# Patient Record
Sex: Male | Born: 1945
Health system: Southern US, Community
[De-identification: ages and names within clinical notes are randomized; demographics above are authoritative.]

## PROBLEM LIST (undated history)

## (undated) DIAGNOSIS — K429 Umbilical hernia without obstruction or gangrene: Secondary | ICD-10-CM

## (undated) DIAGNOSIS — E119 Type 2 diabetes mellitus without complications: Secondary | ICD-10-CM

## (undated) DIAGNOSIS — F329 Major depressive disorder, single episode, unspecified: Secondary | ICD-10-CM

## (undated) DIAGNOSIS — C61 Malignant neoplasm of prostate: Secondary | ICD-10-CM

## (undated) DIAGNOSIS — I10 Essential (primary) hypertension: Secondary | ICD-10-CM

## (undated) DIAGNOSIS — I8393 Asymptomatic varicose veins of bilateral lower extremities: Secondary | ICD-10-CM

## (undated) DIAGNOSIS — R319 Hematuria, unspecified: Secondary | ICD-10-CM

## (undated) DIAGNOSIS — N529 Male erectile dysfunction, unspecified: Secondary | ICD-10-CM

## (undated) DIAGNOSIS — G4700 Insomnia, unspecified: Secondary | ICD-10-CM

## (undated) DIAGNOSIS — I639 Cerebral infarction, unspecified: Secondary | ICD-10-CM

## (undated) DIAGNOSIS — N1831 Chronic kidney disease, stage 3a: Secondary | ICD-10-CM

## (undated) DIAGNOSIS — Z9109 Other allergy status, other than to drugs and biological substances: Secondary | ICD-10-CM

## (undated) DIAGNOSIS — M545 Low back pain, unspecified: Secondary | ICD-10-CM

## (undated) DIAGNOSIS — N189 Chronic kidney disease, unspecified: Secondary | ICD-10-CM

## (undated) DIAGNOSIS — I82409 Acute embolism and thrombosis of unspecified deep veins of unspecified lower extremity: Secondary | ICD-10-CM

## (undated) DIAGNOSIS — M1990 Unspecified osteoarthritis, unspecified site: Secondary | ICD-10-CM

## (undated) DIAGNOSIS — M5430 Sciatica, unspecified side: Secondary | ICD-10-CM

## (undated) DIAGNOSIS — E782 Mixed hyperlipidemia: Secondary | ICD-10-CM

## (undated) DIAGNOSIS — E11649 Type 2 diabetes mellitus with hypoglycemia without coma: Secondary | ICD-10-CM

## (undated) DIAGNOSIS — K635 Polyp of colon: Secondary | ICD-10-CM

## (undated) DIAGNOSIS — D689 Coagulation defect, unspecified: Secondary | ICD-10-CM

## (undated) DIAGNOSIS — E785 Hyperlipidemia, unspecified: Secondary | ICD-10-CM

## (undated) DIAGNOSIS — J029 Acute pharyngitis, unspecified: Secondary | ICD-10-CM

## (undated) DIAGNOSIS — I679 Cerebrovascular disease, unspecified: Secondary | ICD-10-CM

## (undated) DIAGNOSIS — E1142 Type 2 diabetes mellitus with diabetic polyneuropathy: Secondary | ICD-10-CM

## (undated) DIAGNOSIS — F411 Generalized anxiety disorder: Secondary | ICD-10-CM

## (undated) DIAGNOSIS — E559 Vitamin D deficiency, unspecified: Secondary | ICD-10-CM

## (undated) DIAGNOSIS — G8929 Other chronic pain: Secondary | ICD-10-CM

## (undated) DIAGNOSIS — T753XXA Motion sickness, initial encounter: Secondary | ICD-10-CM

## (undated) HISTORY — PX: PROSTATECTOMY: SHX69

## (undated) HISTORY — DX: Vitamin D deficiency, unspecified: E55.9

## (undated) HISTORY — PX: HERNIA REPAIR: SHX51

## (undated) HISTORY — DX: Mixed hyperlipidemia: E78.2

## (undated) HISTORY — DX: Generalized anxiety disorder: F41.1

## (undated) HISTORY — DX: Polyp of colon: K63.5

## (undated) HISTORY — DX: Male erectile dysfunction, unspecified: N52.9

## (undated) HISTORY — DX: Cerebral infarction, unspecified: I63.9

## (undated) HISTORY — PX: EYE SURGERY: SHX253

## (undated) HISTORY — DX: Acute embolism and thrombosis of unspecified deep veins of unspecified lower extremity: I82.409

## (undated) HISTORY — PX: OTHER SURGICAL HISTORY: SHX169

## (undated) HISTORY — DX: Hematuria, unspecified: R31.9

## (undated) HISTORY — DX: Type 2 diabetes mellitus without complications: E11.9

## (undated) HISTORY — PX: COLONOSCOPY: SHX174

## (undated) HISTORY — DX: Umbilical hernia without obstruction or gangrene: K42.9

## (undated) HISTORY — DX: Chronic kidney disease, stage 3a: N18.31

## (undated) HISTORY — DX: Other chronic pain: G89.29

## (undated) HISTORY — DX: Malignant neoplasm of prostate: C61

## (undated) HISTORY — DX: Insomnia, unspecified: G47.00

## (undated) HISTORY — PX: VARICOSE VEIN SURGERY: SHX832

## (undated) HISTORY — DX: Essential (primary) hypertension: I10

## (undated) HISTORY — DX: Type 2 diabetes mellitus with diabetic polyneuropathy: E11.42

## (undated) HISTORY — DX: Motion sickness, initial encounter: T75.3XXA

## (undated) HISTORY — PX: CHOLECYSTECTOMY: SHX55

## (undated) HISTORY — DX: Type 2 diabetes mellitus with hypoglycemia without coma: E11.649

## (undated) HISTORY — DX: Acute pharyngitis, unspecified: J02.9

## (undated) HISTORY — DX: Asymptomatic varicose veins of bilateral lower extremities: I83.93

## (undated) HISTORY — DX: Low back pain, unspecified: M54.50

## (undated) HISTORY — DX: Sciatica, unspecified side: M54.30

## (undated) HISTORY — DX: Major depressive disorder, single episode, unspecified: F32.9

## (undated) HISTORY — DX: Coagulation defect, unspecified: D68.9

---

## 2003-05-11 ENCOUNTER — Emergency Department (HOSPITAL_COMMUNITY): Admission: EM | Admit: 2003-05-11 | Discharge: 2003-05-12 | Payer: Self-pay | Admitting: Emergency Medicine

## 2007-10-28 ENCOUNTER — Encounter: Admission: RE | Admit: 2007-10-28 | Discharge: 2008-01-26 | Payer: Self-pay | Admitting: Family Medicine

## 2008-10-08 ENCOUNTER — Inpatient Hospital Stay (HOSPITAL_COMMUNITY): Admission: RE | Admit: 2008-10-08 | Discharge: 2008-10-09 | Payer: Self-pay | Admitting: Urology

## 2008-10-08 ENCOUNTER — Encounter (INDEPENDENT_AMBULATORY_CARE_PROVIDER_SITE_OTHER): Payer: Self-pay | Admitting: Urology

## 2009-05-20 ENCOUNTER — Ambulatory Visit (HOSPITAL_COMMUNITY): Admission: RE | Admit: 2009-05-20 | Discharge: 2009-05-21 | Payer: Self-pay | Admitting: General Surgery

## 2009-05-22 DIAGNOSIS — I6381 Other cerebral infarction due to occlusion or stenosis of small artery: Secondary | ICD-10-CM

## 2009-05-22 HISTORY — DX: Other cerebral infarction due to occlusion or stenosis of small artery: I63.81

## 2010-02-05 ENCOUNTER — Inpatient Hospital Stay (HOSPITAL_COMMUNITY): Admission: EM | Admit: 2010-02-05 | Discharge: 2010-02-06 | Payer: Self-pay | Admitting: Emergency Medicine

## 2010-02-08 ENCOUNTER — Encounter: Admission: RE | Admit: 2010-02-08 | Discharge: 2010-02-08 | Payer: Self-pay | Admitting: Internal Medicine

## 2010-08-04 LAB — DIFFERENTIAL
Basophils Absolute: 0 10*3/uL (ref 0.0–0.1)
Basophils Absolute: 0 10*3/uL (ref 0.0–0.1)
Basophils Relative: 0 % (ref 0–1)
Basophils Relative: 0 % (ref 0–1)
Eosinophils Absolute: 0.1 10*3/uL (ref 0.0–0.7)
Eosinophils Absolute: 0.1 10*3/uL (ref 0.0–0.7)
Eosinophils Relative: 1 % (ref 0–5)
Eosinophils Relative: 1 % (ref 0–5)
Lymphocytes Relative: 16 % (ref 12–46)
Lymphocytes Relative: 21 % (ref 12–46)
Lymphs Abs: 1.5 10*3/uL (ref 0.7–4.0)
Lymphs Abs: 1.9 10*3/uL (ref 0.7–4.0)
Monocytes Absolute: 0.5 10*3/uL (ref 0.1–1.0)
Monocytes Absolute: 0.8 10*3/uL (ref 0.1–1.0)
Monocytes Relative: 6 % (ref 3–12)
Monocytes Relative: 6 % (ref 3–12)
Neutro Abs: 5.3 10*3/uL (ref 1.7–7.7)
Neutro Abs: 9.2 10*3/uL — ABNORMAL HIGH (ref 1.7–7.7)
Neutrophils Relative %: 71 % (ref 43–77)
Neutrophils Relative %: 76 % (ref 43–77)

## 2010-08-04 LAB — COMPREHENSIVE METABOLIC PANEL
ALT: 12 U/L (ref 0–53)
ALT: 16 U/L (ref 0–53)
AST: 17 U/L (ref 0–37)
AST: 19 U/L (ref 0–37)
Albumin: 3.8 g/dL (ref 3.5–5.2)
Albumin: 4.7 g/dL (ref 3.5–5.2)
Alkaline Phosphatase: 27 U/L — ABNORMAL LOW (ref 39–117)
Alkaline Phosphatase: 33 U/L — ABNORMAL LOW (ref 39–117)
BUN: 16 mg/dL (ref 6–23)
BUN: 20 mg/dL (ref 6–23)
CO2: 24 mEq/L (ref 19–32)
CO2: 25 mEq/L (ref 19–32)
Calcium: 8.5 mg/dL (ref 8.4–10.5)
Calcium: 8.9 mg/dL (ref 8.4–10.5)
Chloride: 104 mEq/L (ref 96–112)
Chloride: 111 mEq/L (ref 96–112)
Creatinine, Ser: 1.17 mg/dL (ref 0.4–1.5)
Creatinine, Ser: 1.35 mg/dL (ref 0.4–1.5)
GFR calc Af Amer: >60 mL/min (ref >60)
GFR calc Af Amer: >60 mL/min (ref >60)
GFR calc non Af Amer: 54 mL/min — ABNORMAL LOW (ref >60)
GFR calc non Af Amer: >60 mL/min (ref >60)
Glucose, Bld: 106 mg/dL — ABNORMAL HIGH (ref 70–99)
Glucose, Bld: 146 mg/dL — ABNORMAL HIGH (ref 70–99)
Potassium: 3.5 mEq/L (ref 3.5–5.1)
Potassium: 3.9 mEq/L (ref 3.5–5.1)
Sodium: 135 mEq/L (ref 135–145)
Sodium: 140 mEq/L (ref 135–145)
Total Bilirubin: 0.6 mg/dL (ref 0.3–1.2)
Total Bilirubin: 0.8 mg/dL (ref 0.3–1.2)
Total Protein: 6.2 g/dL (ref 6.0–8.3)
Total Protein: 7.6 g/dL (ref 6.0–8.3)

## 2010-08-04 LAB — RAPID URINE DRUG SCREEN, HOSP PERFORMED
Amphetamines: NOT DETECTED
Barbiturates: NOT DETECTED
Benzodiazepines: NOT DETECTED
Cocaine: NOT DETECTED
Opiates: NOT DETECTED
Tetrahydrocannabinol: NOT DETECTED

## 2010-08-04 LAB — CBC
HCT: 42 % (ref 39.0–52.0)
HCT: 44.3 % (ref 39.0–52.0)
Hemoglobin: 14.5 g/dL (ref 13.0–17.0)
Hemoglobin: 15.3 g/dL (ref 13.0–17.0)
MCH: 31.5 pg (ref 26.0–34.0)
MCH: 31.6 pg (ref 26.0–34.0)
MCHC: 34.4 g/dL (ref 30.0–36.0)
MCHC: 34.5 g/dL (ref 30.0–36.0)
MCV: 91.3 fL (ref 78.0–100.0)
MCV: 91.8 fL (ref 78.0–100.0)
Platelets: 290 10*3/uL (ref 150–400)
Platelets: 366 10*3/uL (ref 150–400)
RBC: 4.58 MIL/uL (ref 4.22–5.81)
RBC: 4.85 MIL/uL (ref 4.22–5.81)
RDW: 12.8 % (ref 11.5–15.5)
RDW: 12.8 % (ref 11.5–15.5)
WBC: 12 10*3/uL — ABNORMAL HIGH (ref 4.0–10.5)
WBC: 7.4 10*3/uL (ref 4.0–10.5)

## 2010-08-04 LAB — GLUCOSE, CAPILLARY
Glucose-Capillary: 111 mg/dL — ABNORMAL HIGH (ref 70–99)
Glucose-Capillary: 115 mg/dL — ABNORMAL HIGH (ref 70–99)
Glucose-Capillary: 117 mg/dL — ABNORMAL HIGH (ref 70–99)
Glucose-Capillary: 122 mg/dL — ABNORMAL HIGH (ref 70–99)
Glucose-Capillary: 131 mg/dL — ABNORMAL HIGH (ref 70–99)
Glucose-Capillary: 96 mg/dL (ref 70–99)

## 2010-08-04 LAB — APTT: aPTT: 34 seconds (ref 24–37)

## 2010-08-04 LAB — LIPID PANEL
Cholesterol: 170 mg/dL (ref 0–200)
HDL: 35 mg/dL — ABNORMAL LOW (ref >39)
Total CHOL/HDL Ratio: 4.9 RATIO
VLDL: 15 mg/dL (ref 0–40)

## 2010-08-04 LAB — URINALYSIS, ROUTINE W REFLEX MICROSCOPIC
Bilirubin Urine: NEGATIVE
Glucose, UA: NEGATIVE mg/dL
Hgb urine dipstick: NEGATIVE
Ketones, ur: NEGATIVE mg/dL
Nitrite: NEGATIVE
Protein, ur: NEGATIVE mg/dL
Specific Gravity, Urine: 1.005 (ref 1.005–1.030)
Urobilinogen, UA: 0.2 mg/dL (ref 0.0–1.0)
pH: 6 (ref 5.0–8.0)

## 2010-08-04 LAB — PROTIME-INR
INR: 0.95 (ref 0.00–1.49)
Prothrombin Time: 12.9 seconds (ref 11.6–15.2)

## 2010-08-04 LAB — CARDIAC PANEL(CRET KIN+CKTOT+MB+TROPI)
CK, MB: 1.8 ng/mL (ref 0.3–4.0)
Relative Index: INVALID (ref 0.0–2.5)
Total CK: 82 U/L (ref 7–232)
Troponin I: 0.02 ng/mL (ref 0.00–0.06)

## 2010-08-04 LAB — FREE PSA

## 2010-08-04 LAB — HEMOGLOBIN A1C
Hgb A1c MFr Bld: 5.8 % — ABNORMAL HIGH (ref <5.7)
Mean Plasma Glucose: 120 mg/dL — ABNORMAL HIGH (ref <117)

## 2010-08-04 LAB — PSA: PSA: <0.01 ng/mL — ABNORMAL LOW (ref 0.10–4.00)

## 2010-08-04 LAB — CK TOTAL AND CKMB (NOT AT ARMC)
CK, MB: 2.2 ng/mL (ref 0.3–4.0)
Relative Index: 2 (ref 0.0–2.5)
Total CK: 108 U/L (ref 7–232)

## 2010-08-04 LAB — T4, FREE: Free T4: 1.42 ng/dL (ref 0.80–1.80)

## 2010-08-04 LAB — TROPONIN I: Troponin I: 0.02 ng/mL (ref 0.00–0.06)

## 2010-08-22 LAB — URINALYSIS, ROUTINE W REFLEX MICROSCOPIC
Bilirubin Urine: NEGATIVE
Glucose, UA: NEGATIVE mg/dL
Hgb urine dipstick: NEGATIVE
Nitrite: NEGATIVE
Specific Gravity, Urine: 1.02 (ref 1.005–1.030)
pH: 5.5 (ref 5.0–8.0)

## 2010-08-22 LAB — DIFFERENTIAL
Lymphocytes Relative: 18 % (ref 12–46)
Lymphs Abs: 1.4 10*3/uL (ref 0.7–4.0)
Monocytes Absolute: 0.6 10*3/uL (ref 0.1–1.0)
Monocytes Relative: 8 % (ref 3–12)
Neutro Abs: 5.5 10*3/uL (ref 1.7–7.7)

## 2010-08-22 LAB — COMPREHENSIVE METABOLIC PANEL
Albumin: 4.2 g/dL (ref 3.5–5.2)
BUN: 23 mg/dL (ref 6–23)
Creatinine, Ser: 1.2 mg/dL (ref 0.4–1.5)
GFR calc Af Amer: >60 mL/min (ref >60)
Total Protein: 7 g/dL (ref 6.0–8.3)

## 2010-08-22 LAB — GLUCOSE, CAPILLARY
Glucose-Capillary: 109 mg/dL — ABNORMAL HIGH (ref 70–99)
Glucose-Capillary: 116 mg/dL — ABNORMAL HIGH (ref 70–99)
Glucose-Capillary: 132 mg/dL — ABNORMAL HIGH (ref 70–99)

## 2010-08-22 LAB — CBC
HCT: 44.8 % (ref 39.0–52.0)
MCV: 92.6 fL (ref 78.0–100.0)
Platelets: 292 10*3/uL (ref 150–400)
RDW: 13.1 % (ref 11.5–15.5)

## 2010-08-30 LAB — CBC
HCT: 43.9 % (ref 39.0–52.0)
Platelets: 256 10*3/uL (ref 150–400)
WBC: 7.3 10*3/uL (ref 4.0–10.5)

## 2010-08-30 LAB — HEMOGLOBIN AND HEMATOCRIT, BLOOD
HCT: 39.1 % (ref 39.0–52.0)
Hemoglobin: 13.6 g/dL (ref 13.0–17.0)

## 2010-08-30 LAB — GLUCOSE, CAPILLARY
Glucose-Capillary: 102 mg/dL — ABNORMAL HIGH (ref 70–99)
Glucose-Capillary: 168 mg/dL — ABNORMAL HIGH (ref 70–99)
Glucose-Capillary: 180 mg/dL — ABNORMAL HIGH (ref 70–99)
Glucose-Capillary: 93 mg/dL (ref 70–99)

## 2010-08-30 LAB — BASIC METABOLIC PANEL
BUN: 29 mg/dL — ABNORMAL HIGH (ref 6–23)
Creatinine, Ser: 1.26 mg/dL (ref 0.4–1.5)
GFR calc non Af Amer: 58 mL/min — ABNORMAL LOW (ref >60)
Potassium: 4.2 mEq/L (ref 3.5–5.1)

## 2010-08-30 LAB — TYPE AND SCREEN: ABO/RH(D): A POS

## 2010-09-02 ENCOUNTER — Emergency Department (HOSPITAL_COMMUNITY)
Admission: EM | Admit: 2010-09-02 | Discharge: 2010-09-02 | Disposition: A | Discharge status: Home / Self Care | Payer: 59 | Attending: Emergency Medicine | Admitting: Emergency Medicine

## 2010-09-02 DIAGNOSIS — E119 Type 2 diabetes mellitus without complications: Secondary | ICD-10-CM | POA: Insufficient documentation

## 2010-09-02 DIAGNOSIS — I10 Essential (primary) hypertension: Secondary | ICD-10-CM | POA: Insufficient documentation

## 2010-09-02 DIAGNOSIS — F29 Unspecified psychosis not due to a substance or known physiological condition: Secondary | ICD-10-CM | POA: Insufficient documentation

## 2010-09-02 DIAGNOSIS — Z8673 Personal history of transient ischemic attack (TIA), and cerebral infarction without residual deficits: Secondary | ICD-10-CM | POA: Insufficient documentation

## 2010-09-02 DIAGNOSIS — E785 Hyperlipidemia, unspecified: Secondary | ICD-10-CM | POA: Insufficient documentation

## 2010-09-02 DIAGNOSIS — R51 Headache: Secondary | ICD-10-CM | POA: Insufficient documentation

## 2010-10-04 NOTE — Op Note (Signed)
Richard Torres, Richard Torres NO.:  [id number]   MEDICAL RECORD NO.:  [id number]          PATIENT TYPE:  INP   LOCATION:  0001                         FACILITY:  [facility]   PHYSICIAN:  Heloise Purpura, MD      DATE OF BIRTH:  [birth year]   DATE OF PROCEDURE:  10/08/2008  DATE OF DISCHARGE:                               OPERATIVE REPORT   PREOPERATIVE DIAGNOSIS:  Clinically localized adenocarcinoma of the  prostate (clinical stage T1c NX MX).   POSTOPERATIVE DIAGNOSIS:  Clinically localized adenocarcinoma of the  prostate (clinical stage T1c NX MX).   PROCEDURE:  Robotic assisted laparoscopic radical prostatectomy  (bilateral nerve sparing).   SURGEON:  Dr. Heloise Purpura.   ASSISTANT:  Delia Chimes, nurse practitioner.   ANESTHESIA:  General.   COMPLICATIONS:  None.   ESTIMATED BLOOD LOSS:  100 mL.   INTRAVENOUS FLUIDS:  900 mL of lactated Ringer's.   SPECIMENS:  Prostate and seminal vesicles.   DISPOSITION:  Specimens to pathology.   DRAINS:  1. #20-French Coude catheter.  2. #19 Blake pelvic drain.   INDICATION:  Mr. Godbee is a 65 year old gentleman with clinically  localized adenocarcinoma of the prostate.  After undergoing an extensive  discussion regarding management options for treatment, he elected to  proceed with surgical therapy and the above procedure.  The potential  risks, complications, and alternative options were discussed in detail  and informed consent was obtained.   DESCRIPTION OF PROCEDURE:  The patient was taken to the operating room  and a general anesthetic was administered.  He was given preoperative  antibiotics, placed in the dorsal lithotomy position, and prepped and  draped in the usual sterile fashion.  Next a preoperative time-out was  performed.  The Foley catheter was then inserted into the bladder and a  site was selected just superior to the umbilicus for placement of the  camera port.  This was placed using a standard  open Hasson technique  which allowed entry into the peritoneal cavity under direct vision  without difficulty.  A 12 mm port was then placed and a pneumoperitoneum  was established.  The zero degrees lens was used to inspect the abdomen  and there was no evidence for any intra-abdominal injuries.  There were  noted to be some adhesions between the omentum and the abdominal wall  just to the left of the umbilicus.  The right-sided ports were then  placed with an 8-mm robotic port and 12-mm assistant port placed in the  right lower quadrant of the abdomen.  A 5-mm port was placed between the  camera port and the right robotic port.  Visualizing the abdomen from  the right lateral 12-mm port, the previously mentioned adhesions were  taken down with sharp scissor dissection and the left-sided ports were  then placed with two 8-mm robotic ports placed in the left lower  quadrant.  All ports were placed under direct vision without difficulty.  The surgical cart was then docked.  With the aid of the cautery  scissors, the bladder was reflected posteriorly and the space of Retzius  was entered.  The endopelvic fascia was then identified and incised.  The levator muscle fibers went swept off the apex of the prostate  thereby isolating the dorsal venous complex.  The dorsal vein was then  stapled and divided with a 45-mm flex ETS stapler.  Attention then  turned to the bladder neck which was identified with the aid of Foley  catheter manipulation.  The anterior bladder neck was divided, thereby  exposing the Foley catheter.  The catheter balloon was deflated and the  catheter was brought into the operative field and used to retract the  prostate anteriorly.  The posterior bladder neck was then identified and  appeared unremarkable.  It was divided and dissection proceeded between  the prostate and bladder until the vasa deferentia and seminal vesicles  were identified.  The vasa deferentia were  isolated, divided and lifted  anteriorly.  The seminal vesicles were then dissected down to their tips  with care to control the seminal vesicle arterial blood supply.  The  seminal vesicles were then lifted anteriorly in the space between  Denonvilliers fascia and the anterior rectum was bluntly developed  thereby isolating the vascular pedicles of the prostate.  The lateral  prostatic fascia was then incised sharply allowing the neurovascular  bundles to be released bilaterally.  The neurovascular bundles were then  swept off the prostate and the vascular pedicles of prostate were  divided with Hem-o-lok clips and divided with sharp cold scissor  dissection.  The neurovascular bundles were swept off the apex of the  prostate and urethra.  The urethra was then sharply transected and the  prostate specimen was disarticulated.  The pelvis was then copiously  irrigated and hemostasis was ensured.  Attention then turned to the  urethral anastomosis.  A 2-0 Vicryl slip-knot was placed between  Denonvilliers fascia, the posterior bladder neck, and the posterior  urethra to reapproximate these structures.  A double-armed 3-0 Monocryl  suture was then used perform a 360 degrees running tension-free  anastomosis between the bladder neck and urethra.  A new 20-French Coude  catheter was inserted into the bladder and irrigated.  There were no  blood clots within the bladder and the anastomosis appeared to be  watertight.  A #19 Blake drain was then brought through the left robotic  port and positioned appropriately within the pelvis.  It was secured to  skin with a nylon suture.  The surgical cart was then undocked.  The  right lateral 12-mm port site was closed with a zero Vicryl suture  placed of the aid of the laparoscopic suture passer device.  All  remaining ports were then removed under direct vision.  The prostate  specimen was removed intact within the Endopouch retrieval bag via the   periumbilical port site.  This port site was then closed with a running  zero Vicryl suture.  All port sites were injected with 0.25% Marcaine  and reapproximated the skin level with staples.  Sterile dressings were  applied.  The patient appeared to tolerate procedure well without  complications.  He was able to be extubated and transferred to the  recovery unit in satisfactory condition.      Heloise Purpura, MD  Electronically Signed     LB/MEDQ  D:  10/08/2008  T:  10/08/2008  Job:  [id number]

## 2011-07-21 ENCOUNTER — Encounter: Payer: Self-pay | Admitting: Internal Medicine

## 2011-07-21 ENCOUNTER — Observation Stay (HOSPITAL_COMMUNITY)
Admission: AD | Admit: 2011-07-21 | Discharge: 2011-07-23 | Disposition: A | Discharge status: Home / Self Care | Payer: 59 | Source: Ambulatory Visit | Attending: Internal Medicine | Admitting: Internal Medicine

## 2011-07-21 ENCOUNTER — Encounter (HOSPITAL_COMMUNITY): Payer: Self-pay | Admitting: Nurse Practitioner

## 2011-07-21 DIAGNOSIS — D649 Anemia, unspecified: Secondary | ICD-10-CM

## 2011-07-21 DIAGNOSIS — R519 Headache, unspecified: Secondary | ICD-10-CM

## 2011-07-21 DIAGNOSIS — R748 Abnormal levels of other serum enzymes: Secondary | ICD-10-CM | POA: Insufficient documentation

## 2011-07-21 DIAGNOSIS — R509 Fever, unspecified: Principal | ICD-10-CM | POA: Insufficient documentation

## 2011-07-21 DIAGNOSIS — N289 Disorder of kidney and ureter, unspecified: Secondary | ICD-10-CM | POA: Insufficient documentation

## 2011-07-21 DIAGNOSIS — Z79899 Other long term (current) drug therapy: Secondary | ICD-10-CM | POA: Insufficient documentation

## 2011-07-21 DIAGNOSIS — R161 Splenomegaly, not elsewhere classified: Secondary | ICD-10-CM

## 2011-07-21 DIAGNOSIS — R55 Syncope and collapse: Secondary | ICD-10-CM | POA: Insufficient documentation

## 2011-07-21 DIAGNOSIS — R51 Headache: Secondary | ICD-10-CM | POA: Diagnosis present

## 2011-07-21 DIAGNOSIS — Z7982 Long term (current) use of aspirin: Secondary | ICD-10-CM | POA: Insufficient documentation

## 2011-07-21 HISTORY — DX: Hyperlipidemia, unspecified: E78.5

## 2011-07-21 HISTORY — DX: Other allergy status, other than to drugs and biological substances: Z91.09

## 2011-07-21 HISTORY — DX: Headache, unspecified: R51.9

## 2011-07-21 HISTORY — DX: Type 2 diabetes mellitus without complications: E11.9

## 2011-07-21 HISTORY — DX: Male erectile dysfunction, unspecified: N52.9

## 2011-07-21 HISTORY — DX: Abnormal levels of other serum enzymes: R74.8

## 2011-07-21 HISTORY — DX: Cerebrovascular disease, unspecified: I67.9

## 2011-07-21 HISTORY — DX: Malignant neoplasm of prostate: C61

## 2011-07-21 HISTORY — DX: Unspecified osteoarthritis, unspecified site: M19.90

## 2011-07-21 HISTORY — DX: Essential (primary) hypertension: I10

## 2011-07-21 HISTORY — DX: Anemia, unspecified: D64.9

## 2011-07-21 HISTORY — DX: Splenomegaly, not elsewhere classified: R16.1

## 2011-07-21 HISTORY — DX: Disorder of kidney and ureter, unspecified: N28.9

## 2011-07-21 LAB — BASIC METABOLIC PANEL
CO2: 25 mEq/L (ref 19–32)
Chloride: 99 mEq/L (ref 96–112)
Sodium: 132 mEq/L — ABNORMAL LOW (ref 135–145)

## 2011-07-21 LAB — HEPATIC FUNCTION PANEL
AST: 60 U/L — ABNORMAL HIGH (ref 0–37)
Albumin: 3.3 g/dL — ABNORMAL LOW (ref 3.5–5.2)
Alkaline Phosphatase: 46 U/L (ref 39–117)
Total Bilirubin: 0.4 mg/dL (ref 0.3–1.2)

## 2011-07-21 LAB — CBC
Hemoglobin: 11.8 g/dL — ABNORMAL LOW (ref 13.0–17.0)
MCH: 28.6 pg (ref 26.0–34.0)
RBC: 4.13 MIL/uL — ABNORMAL LOW (ref 4.22–5.81)

## 2011-07-21 LAB — TSH: TSH: 1.064 u[IU]/mL (ref 0.350–4.500)

## 2011-07-21 LAB — URINALYSIS, ROUTINE W REFLEX MICROSCOPIC
Glucose, UA: NEGATIVE mg/dL
Hgb urine dipstick: NEGATIVE
Ketones, ur: NEGATIVE mg/dL
Protein, ur: NEGATIVE mg/dL

## 2011-07-21 LAB — DIFFERENTIAL
Lymphs Abs: 0.7 10*3/uL (ref 0.7–4.0)
Monocytes Relative: 10 % (ref 3–12)
Neutro Abs: 3 10*3/uL (ref 1.7–7.7)
Neutrophils Relative %: 72 % (ref 43–77)

## 2011-07-21 LAB — SEDIMENTATION RATE: Sed Rate: 12 mm/hr (ref 0–16)

## 2011-07-21 LAB — GLUCOSE, CAPILLARY

## 2011-07-21 MED ORDER — ACETAMINOPHEN 325 MG PO TABS
650.0000 mg | ORAL_TABLET | Freq: Four times a day (QID) | ORAL | Status: DC | PRN
  Administered 2011-07-21: 650 mg via ORAL
  Filled 2011-07-21: qty 2

## 2011-07-21 MED ORDER — ZOLPIDEM TARTRATE 10 MG PO TABS
10.0000 mg | ORAL_TABLET | Freq: Every evening | ORAL | Status: DC | PRN
  Filled 2011-07-21: qty 1

## 2011-07-21 MED ORDER — ACETAMINOPHEN 325 MG PO TABS
650.0000 mg | ORAL_TABLET | Freq: Four times a day (QID) | ORAL | Status: DC | PRN
  Administered 2011-07-23: 650 mg via ORAL
  Filled 2011-07-21 (×2): qty 2

## 2011-07-21 MED ORDER — DIPHENHYDRAMINE HCL 25 MG PO CAPS
25.0000 mg | ORAL_CAPSULE | Freq: Every evening | ORAL | Status: DC | PRN
  Filled 2011-07-21: qty 1
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
## 2011-07-21 NOTE — H&P (Signed)
Hospital Admission Note Date: 07/21/2011  Patient name: Richard Torres Medical record number: [id number] Date of birth: Mar 22, 1946 Age: 66 y.o. Gender: male PCP: Pearla Dubonnet M.D.  Attending physician:   Pearla Dubonnet M.D.  Chief Complaint: Headaches fevers night sweats, recurrent syncope, now on antibiotic therapy, with persistent night sweats and fevers. Empirically being treated for presumed diverticulitis  History of Present Illness: Richard Torres is an 66 y.o. male who presents with one week of a peculiar illness as follows with progressive headaches and fever, on antibiotic therapy. His syndrome started one week ago, last Friday, when he felt somewhat full after his supper while in [place], [place]. He also felt quite weak.The next morning he developed a headache and felt bloated but had no diarrhea. He felt weak the entire day and was having chills. The next morning, last Sunday morning, February 25th, he felt extremely weak while walking down some steps and had near syncope. He is a diabetic and thought his sugar might be low and had some orange juice and felt better but was taken to an emergency room in [place] and had a rather complete workup with abdominal CT scan that showed a slightly enlarged spleen at 14-1/2 cm. It revealed a prior cholecystectomy. He also had CT scan of his head that was normal. The next day he felt better but still had a low-grade headache and a Monospot from the emergency room because of a complaint of sore throat as well, was negative. Urinalysis was normal except for a few bacteria and nitrite was negative. LFTs were normal and CK-MB was normal. I cell count was 7700 with 81% segmented neutrophils and 9% lymphocytes. He had a slightly high monocyte count. Chest x-ray revealed no acute process. Later that day on Monday he started to feel better and it was felt in my office that he probably had a viral syndrome. Laboratories in my  office November 25 revealed a glucose of 190, creatinine 1.3,White blood cell count 4600 hemoglobin 13.9 and platelet count 194,000 with 77% neutrophils. It was felt that he probably had a viral syndrome at that time and we elected for observation. On February 27, 2 days later he came in after having a presyncopal episode in while standing in testing his blood sugar in his bedroom. He woke up on the floor and thought he was actually back in his bed. EMS was called and he had normal vital signs and normal EKG and on evaluation in my office he had left lower quadrant tenderness but otherwise a nonfocal exam. It was elected to start him on Cipro and Flagyl to see if he empirically improved. On exam he had a supple neck, no complaint of headache, a clear chest, normal cardiac exam without murmur, and a fairly exquisite pain to palpation in his left lower quarter. Rectal exam revealed a flat prostatic fossa secondary to previous prostatectomy for prostate cancer. No rashes were noted. Over the past 48 hours he has had temperature as high as 103.5 which seemed to start right after starting Cipro and Flagyl and headaches have been progressive. Still no stiff neck. No visual problems. Concern is for a cerebritis or focal area of CNS infection or other occult infection. We'll plan to hold antibiotic therapy for now and further workup in the hospital. Concerned about possible neoplasm as well.  Will obtain infectious disease consultation  Past Medical History:     Type 2 Diabetes Mellitus     Adenomatous colon polyps -  followed by Dr. Carman Ching     Hypertension     Hyperlipidemia     DJD of the right shoulder with degenerative labrum     Metabolic syndrome  Meds: Cipro 500 mg b.i.d. For 2 days Flagyl 500 mg b.i.d. For 2 days Fish oil 2 g daily Vitamin D 2000 units daily Cialis 10 mg p.r.n. Aspirin 160 mg daily Lipofen 150 mg daily Combines XR 5-100 mg daily Metoprolol tartrate 25 mg b.i.d. Micardis  40 mg daily Crestor 20 mg daily Cinnamon 1 g daily  Allergies: No known drug allergies History   Social History  . Marital Status:   Married    Spouse Name:  Corrie Dandy N/A    Number of Children:  3 N/A  . Years of Education:  Law school graduate N/A   Occupational History  . Attorney   Social History Main Topics  . Smoking status: none Not on file  . Smokeless tobacco: none Not on file  . Alcohol Use: minimal Not on file  . Drug Use: none Not on file  . Sexually Active: yes Not on file   Other Topics Concern  . Not on file   Social History Narrative  . No narrative on file   Family history:  colon polyps in his mother  Surgical history: Bilateral inguinal hernia repairs, prostatectomy, cholecystectomy Review of Systems: Significant for progressive headache without meningismus, no visual disturbances, no focal neurologic deficits. Positive for progressive night sweats and intermittent fevers as high as 103.5. Patient feels washed out. Has had some left lower quadrant pain that has improved. No dysuria. No obvious nausea and vomiting. Positive for chills. 2 episodes of syncope, possibly vagal. No rashes. No diarrhea  Physical Exam: Pending on admission to hospital Exam from previous day: General Appearance: Alert, fatigued, appears washed out. Head: Normocephalic, without obvious abnormality, atraumatic Neck: Supple, symmetrical, trachea midline, no adenopathy; thyroid: No enlargement/tenderness/nodules; no carotid bruit or JVD Lungs: Clear to auscultation bilaterally, respirations unlabored Heart: Regular rate and rhythm, S1 and S2 normal, no murmur, rub or gallop Abdomen: Soft, non-tender, bowel sounds active all four quadrants, no masses, no organomegaly Extremities: Extremities normal, atraumatic, no cyanosis or edema Pulses: 2+ and symmetric all extremities Skin: Skin color, texture, turgor normal, no rashes or lesions Neuro: CNII-XII intact. Normal strength, sensation  and reflexes throughout  Lab results:  As per history of present illness.  Imaging results:  noncontrast head CT, abdominal CT scanning, and chest x-ray were all within normal limits on evaluation on February 23 in [place].  Assessment & Plan: Progressive headache and febrile illness - concerned about possible CNS infection. Needs inpatient workup. We'll consult infectious diseases. May simply be a viral syndrome. Also concerned about occult neoplasm Type 2 diabetes mellitus - check hemoglobin A1c and use sliding scale of NovoLog insulin and hold Kombyglyze Hypertension - aware Left lower quadrant pain felt to be possible diverticulitis - consider repeat CT scanning Hyperlipidemia - aware    Katheryn Culliton NEVILL 07/21/2011, 8:30 AM

## 2011-07-22 LAB — LACTATE DEHYDROGENASE: LDH: 367 U/L — ABNORMAL HIGH (ref 94–250)

## 2011-07-22 LAB — SEDIMENTATION RATE: Sed Rate: 15 mm/hr (ref 0–16)

## 2011-07-22 LAB — HIV ANTIBODY (ROUTINE TESTING W REFLEX): HIV: NONREACTIVE

## 2011-07-22 LAB — GLUCOSE, CAPILLARY
Glucose-Capillary: 172 mg/dL — ABNORMAL HIGH (ref 70–99)
Glucose-Capillary: 130 mg/dL — ABNORMAL HIGH (ref 70–99)

## 2011-07-22 MED ORDER — SENNOSIDES-DOCUSATE SODIUM 8.6-50 MG PO TABS
1.0000 | ORAL_TABLET | Freq: Every evening | ORAL | Status: DC | PRN
  Administered 2011-07-22: 1 via ORAL
  Filled 2011-07-22: qty 1

## 2011-07-22 MED ORDER — ACETAMINOPHEN 325 MG PO TABS
650.0000 mg | ORAL_TABLET | Freq: Once | ORAL | Status: AC
  Administered 2011-07-22: 650 mg via ORAL

## 2011-07-22 NOTE — Progress Notes (Signed)
Subjective: Had severe headache last night and took tylenol for this before "my fever could set in" less night sweats last nigtht   Antibiotics:  Anti-infectives    None      Medications: Scheduled Meds:   . acetaminophen  650 mg Oral Once   Continuous Infusions:  PRN Meds:.acetaminophen, diphenhydrAMINE, zolpidem, DISCONTD: acetaminophen   Objective: Weight change:  No intake or output data in the 24 hours ending 07/22/11 1036 Blood pressure 129/80, pulse 67, temperature 98.5 F (36.9 C), temperature source Oral, resp. rate 18, height 6\' 1"  (1.854 m), weight 185 lb (83.915 kg), SpO2 96.00%. Temp:  [97.6 F (36.4 C)-100.1 F (37.8 C)] 98.5 F (36.9 C) (03/02 0542) Pulse Rate:  [64-76] 67  (03/02 0542) Resp:  [18-20] 18  (03/02 0542) BP: (106-129)/(69-80) 129/80 mmHg (03/02 0542) SpO2:  [94 %-99 %] 96 % (03/02 0542) Weight:  [185 lb (83.915 kg)] 185 lb (83.915 kg) (03/01 1223)  Physical Exam: General: Alert and awake, oriented x3, not in any acute distress. HEENT: anicteric sclera, pupils reactive to light and accommodation, EOMI CVS regular rate, normal r,  no murmur rubs or gallops Chest: clear to auscultation bilaterally, no wheezing, rales or rhonchi Abdomen: soft nontender, nondistended, normal bowel sounds, Extremities: no  clubbing or edema noted bilaterally Skin: no rashes Lymph: no new lymphadenopathy Neuro: nonfocal  Lab Results:  Basename 07/21/11 1245  WBC 4.1  HGB 11.8*  HCT 36.9*  PLT 193    BMET  Basename 07/21/11 1245  NA 132*  K 3.8  CL 99  CO2 25  GLUCOSE 140*  BUN 17  CREATININE 1.40*  CALCIUM 9.0    Micro Results: No results found for this or any previous visit (from the past 240 hour(s)).  Studies/Results: No results found.    Assessment/Plan: Richard Torres is a 66 y.o. male with  FUO with one week hx of fevers, drenching night sweats, headaches with workup that has included negative CXR, CT abd pelvis showing  splenomegaly, labs showing transamitiis, negative blood cultures. He faield to respond to cipro/flagyl for empriic rx of diverticuliltis.  FUO:  --I agree with Dr. Orvan Falconer that a viral illness may be the cause here.  --in addition to checking for EBV,  CMV by abs will check CMV PCR blood, an HIV ab, RNA, hepatitis panel --I will order additional screenjing labs in FUO panel with RF< ANA, CPK, LDH, quantiferon gold --I certainly think aseptic meningitis is also a possible again more likely due to a virus such as HSV 2, 1, or enterovirus, less likely due to TB or a dimorphic fungus --if he is still fevering and with significant headache would consider LP tomorrow and could also consider MRI     LOS: 1 day   Acey Lav 07/22/2011, 10:36 AM

## 2011-07-22 NOTE — Progress Notes (Signed)
Subjective: Currently patient is afebrile, there's no cough no dysuria no productive mucus no URI symptoms no abdominal pain. Admission H&P and infectious disease consults have been reviewed.  Objective: Vital signs in last 24 hours: Temp:  [98.1 F (36.7 C)-100.1 F (37.8 C)] 98.5 F (36.9 C) (03/02 0542) Pulse Rate:  [65-76] 67  (03/02 0542) Resp:  [18-20] 18  (03/02 0542) BP: (107-129)/(72-80) 129/80 mmHg (03/02 0542) SpO2:  [94 %-99 %] 96 % (03/02 0542) Weight change:     Intake/Output from previous day:   Intake/Output this shift:    General appearance: alert and cooperative Resp: clear to auscultation bilaterally Cardio: regular rate and rhythm, S1, S2 normal, no murmur, click, rub or gallop Extremities: extremities normal, atraumatic, no cyanosis or edema Neurologic: Grossly normal  Lab Results:  Results for orders placed during the hospital encounter of 07/21/11 (from the past 24 hour(s))  URINALYSIS, ROUTINE W REFLEX MICROSCOPIC     Status: Abnormal   Collection Time   07/21/11  2:58 PM      Component Value Range   Color, Urine YELLOW  YELLOW    APPearance CLEAR  CLEAR    Specific Gravity, Urine 1.014  1.005 - 1.030    pH 6.5  5.0 - 8.0    Glucose, UA NEGATIVE  NEGATIVE (mg/dL)   Hgb urine dipstick NEGATIVE  NEGATIVE    Bilirubin Urine NEGATIVE  NEGATIVE    Ketones, ur NEGATIVE  NEGATIVE (mg/dL)   Protein, ur NEGATIVE  NEGATIVE (mg/dL)   Urobilinogen, UA 4.0 (*) 0.0 - 1.0 (mg/dL)   Nitrite NEGATIVE  NEGATIVE    Leukocytes, UA NEGATIVE  NEGATIVE   GLUCOSE, CAPILLARY     Status: Abnormal   Collection Time   07/21/11  4:17 PM      Component Value Range   Glucose-Capillary 155 (*) 70 - 99 (mg/dL)  GLUCOSE, CAPILLARY     Status: Abnormal   Collection Time   07/21/11  9:39 PM      Component Value Range   Glucose-Capillary 181 (*) 70 - 99 (mg/dL)   Comment 1 Notify RN    GLUCOSE, CAPILLARY     Status: Abnormal   Collection Time   07/22/11  8:09 AM      Component  Value Range   Glucose-Capillary 172 (*) 70 - 99 (mg/dL)  SEDIMENTATION RATE     Status: Normal   Collection Time   07/22/11 11:35 AM      Component Value Range   Sed Rate 15  0 - 16 (mm/hr)  CK     Status: Normal   Collection Time   07/22/11 11:35 AM      Component Value Range   Total CK 97  7 - 232 (U/L)  LACTATE DEHYDROGENASE     Status: Abnormal   Collection Time   07/22/11 11:35 AM      Component Value Range   LD 367 (*) 94 - 250 (U/L)  GLUCOSE, CAPILLARY     Status: Abnormal   Collection Time   07/22/11 11:57 AM      Component Value Range   Glucose-Capillary 110 (*) 70 - 99 (mg/dL)      Studies/Results: No results found.  Medications:  Prior to Admission:  Prescriptions prior to admission  Medication Sig Dispense Refill  . acetaminophen (TYLENOL) 325 MG tablet Take 650 mg by mouth every 6 (six) hours as needed.      Marland Kitchen aspirin EC 81 MG tablet Take 162 mg by  mouth at bedtime.      . cetirizine (ZYRTEC) 10 MG tablet Take 10 mg by mouth daily.      . cholecalciferol (VITAMIN D) 1000 UNITS tablet Take 1,000 Units by mouth at bedtime.      . Cinnamon 500 MG TABS Take 1 tablet by mouth at bedtime.      . ciprofloxacin (CIPRO) 500 MG tablet Take 500 mg by mouth 2 (two) times daily.      . Fenofibrate (LIPOFEN) 150 MG CAPS Take by mouth.      . fish oil-omega-3 fatty acids 1000 MG capsule Take 1 g by mouth 2 (two) times daily.      . metoprolol tartrate (LOPRESSOR) 25 MG tablet Take 25 mg by mouth 2 (two) times daily.      . metroNIDAZOLE (FLAGYL) 500 MG tablet Take 500 mg by mouth 2 (two) times daily.      . Probiotic Product (RA PROBIOTIC COLON CARE) CAPS Take 1 capsule by mouth daily.      . rosuvastatin (CRESTOR) 20 MG tablet Take 10 mg by mouth daily.      . Saxagliptin-Metformin (KOMBIGLYZE XR) 09-998 MG TB24 Take 1 tablet by mouth at bedtime.      Marland Kitchen telmisartan (MICARDIS) 40 MG tablet Take 40 mg by mouth at bedtime.       Scheduled:   . acetaminophen  650 mg Oral Once    Continuous:   Assessment/Plan: Fever of unknown origin, workup as outlined. Case discussed with patient and his wife in detail, all questions answered.  LOS: 1 day   Akiem Urieta D 07/22/2011, 2:13 PM

## 2011-07-23 LAB — URINE CULTURE
Colony Count: NO GROWTH
Culture: NO GROWTH

## 2011-07-23 LAB — HEPATITIS PANEL, ACUTE
Hep A IgM: NEGATIVE
Hep B C IgM: NEGATIVE

## 2011-07-23 MED ORDER — MAGNESIUM CITRATE PO SOLN
1.0000 | Freq: Once | ORAL | Status: AC
  Administered 2011-07-23: 1 via ORAL

## 2011-07-23 MED ORDER — ACETAMINOPHEN 325 MG PO TABS: 650.0000 mg | ORAL_TABLET | ORAL | Status: DC | PRN

## 2011-07-23 NOTE — Progress Notes (Signed)
Subjective: Headaches are worse today   Antibiotics:  Anti-infectives    None      Medications: Scheduled Meds:    . magnesium citrate  1 Bottle Oral Once   Continuous Infusions:  PRN Meds:.acetaminophen, diphenhydrAMINE, senna-docusate, zolpidem, DISCONTD: acetaminophen   Objective: Weight change:   Intake/Output Summary (Last 24 hours) at 07/23/11 1248 Last data filed at 07/22/11 1330  Gross per 24 hour  Intake    240 ml  Output      0 ml  Net    240 ml   Blood pressure 137/77, pulse 65, temperature 98.7 F (37.1 C), temperature source Oral, resp. rate 16, height 6\' 1"  (1.854 m), weight 185 lb (83.915 kg), SpO2 95.00%. Temp:  [98.1 F (36.7 C)-99.3 F (37.4 C)] 98.7 F (37.1 C) (03/03 0530) Pulse Rate:  [65-70] 65  (03/03 0530) Resp:  [16-20] 16  (03/03 0530) BP: (122-144)/(77-84) 137/77 mmHg (03/03 0530) SpO2:  [95 %-97 %] 95 % (03/03 0530)  Physical Exam: General: Alert and awake, oriented x3, not in any acute distress. HEENT: anicteric sclera, pupils reactive to light and accommodation, EOMI CVS regular rate, normal r,  no murmur rubs or gallops Chest: clear to auscultation bilaterally, no wheezing, rales or rhonchi Abdomen: soft nontender, nondistended, normal bowel sounds, Extremities: no  clubbing or edema noted bilaterally Skin: no rashes Lymph: no new lymphadenopathy Neuro: nonfocal  Lab Results:  Basename 07/21/11 1245  WBC 4.1  HGB 11.8*  HCT 36.9*  PLT 193    BMET  Basename 07/21/11 1245  NA 132*  K 3.8  CL 99  CO2 25  GLUCOSE 140*  BUN 17  CREATININE 1.40*  CALCIUM 9.0    Micro Results: Recent Results (from the past 240 hour(s))  URINE CULTURE     Status: Normal   Collection Time   07/21/11  2:58 PM      Component Value Range Status Comment   Specimen Description URINE, CLEAN CATCH   Final    Special Requests NONE   Final    Culture  Setup Time [id number]   Final    Colony Count NO GROWTH   Final    Culture NO GROWTH    Final    Report Status 07/23/2011 FINAL   Final   CULTURE, BLOOD (ROUTINE X 2)     Status: Normal (Preliminary result)   Collection Time   07/22/11  3:45 AM      Component Value Range Status Comment   Specimen Description BLOOD LEFT ANTECUBITAL   Final    Special Requests BOTTLES DRAWN AEROBIC AND ANAEROBIC 3CC   Final    Culture  Setup Time [id number]   Final    Culture     Final    Value:        BLOOD CULTURE RECEIVED NO GROWTH TO DATE CULTURE WILL BE HELD FOR 5 DAYS BEFORE ISSUING A FINAL NEGATIVE REPORT   Report Status PENDING   Incomplete   CULTURE, BLOOD (ROUTINE X 2)     Status: Normal (Preliminary result)   Collection Time   07/22/11  3:50 AM      Component Value Range Status Comment   Specimen Description BLOOD RIGHT HAND   Final    Special Requests BOTTLES DRAWN AEROBIC AND ANAEROBIC [facility]   Final    Culture  Setup Time [id number]   Final    Culture     Final    Value:        BLOOD CULTURE  RECEIVED NO GROWTH TO DATE CULTURE WILL BE HELD FOR 5 DAYS BEFORE ISSUING A FINAL NEGATIVE REPORT   Report Status PENDING   Incomplete     Studies/Results: No results found.    Assessment/Plan: ROJELIO UHRICH is a 66 y.o. male with  FUO with one week hx of fevers, drenching night sweats, headaches with workup that has included negative CXR, CT abd pelvis showing splenomegaly, labs showing transamitiis, negative blood cultures. He faield to respond to cipro/flagyl for empriic rx of diverticuliltis. FUO labs HIV hep panel ck normal. LDH slightly up. CMV, EBV abs, Qferon gold pending.  FUO: --most likely a viral infection ---I certainly think aseptic meningitis is also a possible again more likely due to a virus such as HSV 2, 1, or enterovirus, less likely due to TB or a dimorphic fungus --I am OK to observe the pt further despite worsening headaches (this would not be unusual for pt with viral aseptic meningitis to have severe HA (often requring narcotics for relief), and I would  consider giving him a low potency narcotic such as hydrocodone or oxycodone if ha not controllable with tyelenol --IF he dc to home today he should fu with myself or Dr. Cathie Olden in [facility] by Wed or THursday of this coming week to fu how he is doing. If ha continued to worsen would then get large volume LP with cell count diff, glucose, protein, CSF for enterovirus pcr, CSF for HSV1 and 2 by PCR, CSF crypto ag would also consider setting up dedicated 8ml spun down for AFB and fungal cx     LOS: 2 days   Acey Lav 07/23/2011, 12:48 PM

## 2011-07-23 NOTE — Discharge Summary (Signed)
Physician Discharge Summary  Patient ID: Richard Torres MRN: [id number] DOB/AGE: 28-Dec-1945 66 y.o.  Admit date: 07/21/2011 Discharge date: 07/23/2011  Admission Diagnoses: Fever  Discharge Diagnoses:  Active Problems:  Fever  Headache  Splenomegaly  Elevated liver enzymes  Normocytic anemia  Renal insufficiency  Syncope   Discharged Condition: stable  Hospital Course: Patient was directly admitted to the hospital for fever of unknown origin. Please see admission H&P for series of events today to admission. Upon admission to the hospital patient was seen by infectious disease. Several lab studies we'll order, Cipro Flagyl would DC. Patient's temperature curve decreased on hospitalization. Patient did have some mild headache during hospitalization which quickly resolved with Tylenol. Myself and infectious disease discussed next course of action with patient which included a lumbar puncture if persistent symptoms, there are plans patient followup with infectious disease physician in 3 days and with his primary M.D. in approximately one week.  Consults:   infectious disease physician  Significant Diagnostic Studies: Rheumatoid factor [[id number]] Collection: 07/23/11 0441 Resulted: 07/23/11 1103 Specimen Type: Blood Rheumatoid Factor <10 IU/mL Culture, blood (routine x 2) [[id number]] Collection: 07/22/11 0345 Resulted: 07/23/11 0854 Specimen Type: Blood Specimen Source: BLOOD Specimen Description Result: BLOOD LEFT ANTECUBITAL Special Requests Result: BOTTLES DRAWN AEROBIC AND ANAEROBIC 3CC Culture Setup Time [id number] Culture Result: BLOOD CULTURE RECEIVED NO GROWTH TO DATE CULTURE WILL BE HELD FOR 5 DAYS BEFORE ISSUING A FINAL NEGATIVE REPORT Report Status PENDING Culture, blood (routine x 2) [[id number]] Collection: 07/22/11 0350 Resulted: 07/23/11 0854 Specimen Type: Blood Specimen Source: BLOOD Specimen Description Result: BLOOD RIGHT HAND Special Requests Result: BOTTLES DRAWN AEROBIC AND  ANAEROBIC 3CC Culture Setup Time [id number] Culture Result: BLOOD CULTURE RECEIVED NO GROWTH TO DATE CULTURE WILL BE HELD FOR 5 DAYS BEFORE ISSUING A FINAL NEGATIVE REPORT Report Status PENDING Glucose, capillary [[id number]] Abnormal Collection: 07/23/11 0744 Resulted: 07/23/11 0822 Glucose-Capillary 133 mg/dL H Hepatitis panel, acute [[id number]] Collection: 07/22/11 1135 Resulted: 07/23/11 0319 Specimen Type: Blood Hepatitis B Surface Ag NEGATIVE HCV Ab NEGATIVE Hep A IgM NEGATIVE Hep B C IgM NEGATIVE Urine culture [[id number]] Collection: 07/21/11 1458 Resulted: 07/23/11 0222 Specimen Type: Urine Specimen Source: Urine, Clean Catch Specimen Description Result: URINE, CLEAN CATCH Special Requests NONE Culture Setup Time [id number] Colony Count NO GROWTH Culture NO GROWTH Report Status Result: 07/23/2011 FINAL HIV antibody [[id number]] Collection: 07/22/11 1135 Resulted: 07/22/11 2022 Specimen Type: Blood HIV NON REACTIVE Glucose, capillary [[id number]] Abnormal Collection: 07/22/11 1718 Resulted: 07/22/11 1719 Glucose-Capillary 130 mg/dL H Sedimentation rate [[id number]] Collection: 07/22/11 1135 Resulted: 07/22/11 1249 Specimen Type: Blood Sed Rate 15 mm/hr Glucose, capillary [[id number]]   Discharge Exam: Blood pressure 137/77, pulse 65, temperature 98.7 F (37.1 C), temperature source Oral, resp. rate 16, height 6\' 1"  (1.854 m), weight 83.915 kg (185 lb), SpO2 95.00%. General appearance: alert and cooperative Resp: clear to auscultation bilaterally Cardio: regular rate and rhythm, S1, S2 normal, no murmur, click, rub or gallop GI: soft, non-tender; bowel sounds normal; no masses,  no organomegaly Extremities: extremities normal, atraumatic, no cyanosis or edema Neurologic: Grossly normal  Disposition: 01-Home or Self Care   Medication List  As of 07/23/2011 12:53 PM   STOP taking these medications         cetirizine 10 MG tablet      ciprofloxacin 500 MG tablet      metroNIDAZOLE 500 MG tablet       RA PROBIOTIC COLON CARE Caps         TAKE these medications  acetaminophen 325 MG tablet   Commonly known as: TYLENOL   Take 650 mg by mouth every 6 (six) hours as needed.      aspirin EC 81 MG tablet   Take 162 mg by mouth at bedtime.      cholecalciferol 1000 UNITS tablet   Commonly known as: VITAMIN D   Take 1,000 Units by mouth at bedtime.      Cinnamon 500 MG Tabs   Take 1 tablet by mouth at bedtime.      fish oil-omega-3 fatty acids 1000 MG capsule   Take 1 g by mouth 2 (two) times daily.      KOMBIGLYZE XR 09-998 MG Tb24   Generic drug: Saxagliptin-Metformin   Take 1 tablet by mouth at bedtime.      LIPOFEN 150 MG Caps   Generic drug: Fenofibrate   Take by mouth.      metoprolol tartrate 25 MG tablet   Commonly known as: LOPRESSOR   Take 25 mg by mouth 2 (two) times daily.      rosuvastatin 20 MG tablet   Commonly known as: CRESTOR   Take 10 mg by mouth daily.      telmisartan 40 MG tablet   Commonly known as: MICARDIS   Take 40 mg by mouth at bedtime.           Follow-up Information    Follow up with GATES,ROBERT NEVILL, MD in 1 week.   Contact information:   [address] [place] [address] [phone]       Follow up with Acey Lav, MD in 3 days.   Contact information:   [address] [place] [address] [phone]          Signed: Katy Apo 07/23/2011, 12:53 PM

## 2011-07-23 NOTE — Progress Notes (Signed)
Pt. Complaining of a headache that feels sharp and is all over his head. States it is different from the headache he was experiencing earlier in the week. Pt. Also states he has not had a bowel movement in about 4 days. Dr. Nehemiah Settle called and orders given. One bottle of magnesium citrate given and 2 tylenol. He is also using an ice pack on his head. Pt. States his headache got worse after taking a nap.Pt. Sitting up in chair eating lunch, states his headache is feeling better. Dr. Nehemiah Settle in to discharger pt. IV dc'd.

## 2011-07-24 ENCOUNTER — Telehealth: Payer: Self-pay | Admitting: Infectious Disease

## 2011-07-24 LAB — EPSTEIN-BARR VIRUS VCA ANTIBODY PANEL
EBV EA IgG: 0.08 {ISR}
EBV NA IgG: 3.63 {ISR} — ABNORMAL HIGH
EBV NA IgG: 3.4 {ISR} — ABNORMAL HIGH

## 2011-07-24 LAB — GLUCOSE, CAPILLARY: Glucose-Capillary: 153 mg/dL — ABNORMAL HIGH (ref 70–99)

## 2011-07-24 NOTE — Telephone Encounter (Signed)
Richard Torres, Richard Torres needs hsfu with me or Jonny Ruiz this week Wed or THursday preferably.

## 2011-07-25 LAB — QUANTIFERON TB GOLD ASSAY (BLOOD): Interferon Gamma Release Assay: NEGATIVE

## 2011-07-26 LAB — HIV-1 RNA QUANT-NO REFLEX-BLD: HIV-1 RNA Quant, Log: <1.3 {Log} (ref <1.30)

## 2011-07-27 ENCOUNTER — Encounter: Payer: Self-pay | Admitting: Infectious Disease

## 2011-07-27 ENCOUNTER — Ambulatory Visit (INDEPENDENT_AMBULATORY_CARE_PROVIDER_SITE_OTHER): Payer: 59 | Admitting: Infectious Disease

## 2011-07-27 DIAGNOSIS — R55 Syncope and collapse: Secondary | ICD-10-CM

## 2011-07-27 DIAGNOSIS — R161 Splenomegaly, not elsewhere classified: Secondary | ICD-10-CM

## 2011-07-27 DIAGNOSIS — R509 Fever, unspecified: Secondary | ICD-10-CM | POA: Insufficient documentation

## 2011-07-27 NOTE — Assessment & Plan Note (Signed)
Thought sec to viral infection. EBV is remote. CMV not likely with no viremia.  THere remains poss of malignancy but again would like to wait and see how he does over next month before further workup

## 2011-07-27 NOTE — Assessment & Plan Note (Signed)
counselled to keep checkin bp [facility] when he becomes ill again in future to consider holding bp meds

## 2011-07-27 NOTE — Progress Notes (Signed)
Subjective:    Patient ID: Richard Torres, male    DOB: 1946/04/20, 66 y.o.   MRN: [id number]  HPI  Richard Torres is a 66 y.o. male with FUO with one week hx of fevers, drenching night sweats, headaches with workup that has included negative CXR, CT abd pelvis showing splenomegaly, labs showing transamitiis, negative blood cultures. He faield to respond to cipro/flagyl for empriic rx of diverticuliltis. FUO labs HIV hep panel, EBV serologies c/w past infection CMV VL negagive Qferon gold negative, ANA, RF, ESR negative,  ck normal. LDH slightly up. WHen I last saw him as an inpatient in [place] [facility] he was still suffering from severe headache but fever curve had improved. Since DC from the hospital he has had dramatic improvement in HA. He is having less problems with "night sweats." He is back ag work and exercising. He has been taking temperatures thorughout the day and has measured temps as low as 92 and as high as 100. He is feeling much better with return of appetitie. His bp meds have been restarted. I spent greater than 45 minutes with the patient including greater than 50% of time in face to face counsel of the patient and in coordination of their care.    Review of Systems  Constitutional: Positive for fever, chills and fatigue. Negative for diaphoresis, activity change, appetite change and unexpected weight change.  HENT: Negative for congestion, sore throat, rhinorrhea, sneezing, trouble swallowing and sinus pressure.   Eyes: Negative for photophobia and visual disturbance.  Respiratory: Negative for cough, chest tightness, shortness of breath, wheezing and stridor.   Cardiovascular: Negative for chest pain, palpitations and leg swelling.  Gastrointestinal: Negative for nausea, vomiting, abdominal pain, diarrhea, constipation, blood in stool, abdominal distention and anal bleeding.  Genitourinary: Negative for dysuria, hematuria, flank pain and difficulty urinating.    Musculoskeletal: Negative for myalgias, back pain, joint swelling, arthralgias and gait problem.  Skin: Negative for color change, pallor, rash and wound.  Neurological: Positive for headaches. Negative for dizziness, tremors, weakness and light-headedness.  Hematological: Negative for adenopathy. Does not bruise/bleed easily.  Psychiatric/Behavioral: Negative for behavioral problems, confusion, sleep disturbance, dysphoric mood, decreased concentration and agitation.       Objective:   Physical Exam  Constitutional: He is oriented to person, place, and time. He appears well-developed and well-nourished. No distress.  HENT:  Head: Normocephalic and atraumatic.  Mouth/Throat: Oropharyngeal exudate present.  Eyes: Conjunctivae and EOM are normal. Pupils are equal, round, and reactive to light. No scleral icterus.  Neck: Normal range of motion. Neck supple. No JVD present.  Cardiovascular: Normal rate, regular rhythm and normal heart sounds.  Exam reveals no gallop and no friction rub.   No murmur heard. Pulmonary/Chest: Effort normal and breath sounds normal. No respiratory distress. He has no wheezes. He has no rales. He exhibits no tenderness.  Abdominal: He exhibits no distension and no mass. There is no tenderness. There is no rebound and no guarding.  Musculoskeletal: He exhibits no edema and no tenderness.  Lymphadenopathy:    He has no cervical adenopathy.  Neurological: He is alert and oriented to person, place, and time. He has normal reflexes. He exhibits normal muscle tone. Coordination normal.  Skin: Skin is warm and dry. He is not diaphoretic. No erythema. No pallor.  Psychiatric: He has a normal mood and affect. His behavior is normal. Judgment and thought content normal.          Assessment & Plan:  FUO (  fever of unknown origin) Seems most likely to have been viral infection. Will have him fu with me in late APril (earlier if ssx recur/worsen). Will ONLY consider  further workup if fevers or other ssx have persisted  Splenomegaly Thought sec to viral infection. EBV is remote. CMV not likely with no viremia.  THere remains poss of malignancy but again would like to wait and see how he does over next month before further workup  Syncope counselled to keep checkin bp [facility] when he becomes ill again in future to consider holding bp meds

## 2011-07-27 NOTE — Assessment & Plan Note (Signed)
Seems most likely to have been viral infection. Will have him fu with me in late APril (earlier if ssx recur/worsen). Will ONLY consider further workup if fevers or other ssx have persisted

## 2011-07-28 LAB — CULTURE, BLOOD (ROUTINE X 2)
Culture  Setup Time: 201303021122
Culture: NO GROWTH

## 2011-08-10 LAB — CMV (CYTOMEGALOVIRUS) DNA ULTRAQUANT, PCR: CMV DNA Quant: <200 copies/mL (ref <200)

## 2011-09-13 ENCOUNTER — Ambulatory Visit: Payer: 59 | Admitting: Infectious Disease

## 2013-02-06 ENCOUNTER — Ambulatory Visit (INDEPENDENT_AMBULATORY_CARE_PROVIDER_SITE_OTHER): Payer: Self-pay | Admitting: General Surgery

## 2013-02-10 ENCOUNTER — Encounter (INDEPENDENT_AMBULATORY_CARE_PROVIDER_SITE_OTHER): Payer: Self-pay | Admitting: General Surgery

## 2013-02-10 ENCOUNTER — Ambulatory Visit (INDEPENDENT_AMBULATORY_CARE_PROVIDER_SITE_OTHER): Payer: Commercial Managed Care - PPO | Admitting: General Surgery

## 2013-02-10 VITALS — BP 104/66 | HR 60 | Temp 97.3°F | Resp 14 | Ht 73.0 in | Wt 184.2 lb

## 2013-02-10 DIAGNOSIS — K432 Incisional hernia without obstruction or gangrene: Secondary | ICD-10-CM

## 2013-02-10 HISTORY — DX: Incisional hernia without obstruction or gangrene: K43.2

## 2013-02-10 NOTE — Patient Instructions (Signed)
You have a small hernia at your umbilicus. Because you have had 2 laparoscopic incisions at that site, I think this is probably a small incisional hernia. It is reducible and so there is no immediate danger.  The small ridge that you see in your upper abdomen when you do a sit up is a diastasis recti, and that is not a hernia and nothing further needs to be done about that.  Because of the blood thinners, we should postpone any consideration of surgery  for later.  Call me in a few months, once they take you off of the blood thinner, and if you're interested in having the hernia repaired.  you may continue to exercise without restriction.     Umbilical Herniorrhaphy Herniorrhaphy is surgery to repair a hernia. A hernia is the protrusion of a part of an organ through an abdominal opening. An umbilical hernia means that your hernia is in the area around your navel. If the hernia is not repaired, the gap could get bigger. Your intestines or other tissues, such as fat, could get trapped in the gap. This can lead to other health problems, such as blocked intestines. If the hernia is fixed before problems set in, you may be allowed to go home the same day as the surgery (outpatient). LET YOUR CAREGIVER KNOW ABOUT:  Allergies to food or medicine.  Medicines taken, including vitamins, herbs, eyedrops, over-the-counter medicines, and creams.  Use of steroids (by mouth or creams).  Previous problems with anesthetics or numbing medicines.  History of bleeding problems or blood clots.  Previous surgery.  Other health problems, including diabetes and kidney problems.  Possibility of pregnancy, if this applies. RISKS AND COMPLICATIONS  Pain.  Excessive bleeding.  Hematoma. This is a pocket of blood that collects under the surgery site.  Infection at the surgery site.  Numbness at the surgery site.  Swelling and bruising.  Blood clots.  Intestinal damage (rare).  Scarring.  Skin  damage.  Development of another hernia. This may require another surgery. BEFORE THE PROCEDURE  Ask your caregiver about changing or stopping your regular medicines. You may need to stop taking aspirin, nonsteroidal anti-inflammatory drugs (NSAIDs), vitamin E, and blood thinners as early as 2 weeks before the procedure.  Do not eat or drink for 8 hours before the procedure, or as directed by your caregiver.  You might be asked to shower or wash with an antibacterial soap before the procedure.  Wear comfortable clothes that will be easy to put on after the procedure. PROCEDURE You will be given an intravenous (IV) tube. A needle will be inserted in your arm. Medicine will flow directly into your body through this needle. You might be given medicine to help you relax (sedative). You will be given medicine that numbs the area (local anesthetic) or medicine that makes you sleep (general anesthetic). If you have open surgery:  The surgeon will make a cut (incision) in your abdomen.  The gap in the muscle wall will be repaired. The surgeon may sew the edges together over the gap or use a mesh material to strengthen the area. When mesh is used, the body grows new, strong tissue into and around it. This new tissue closes the gap.  A drain might be put in to remove excess fluid from the body after surgery.  The surgeon will close the incision with stitches, glue, or staples. If you have laparoscopic surgery:  The surgeon will make several small incisions in your abdomen.  A thin, lighted tube (laparoscope) will be inserted into the abdomen through an incision. A camera is attached to the laparoscope that allows the surgeon to see inside the abdomen.  Tools will be inserted through the other incisions to repair the hernia. Usually, mesh is used to cover the gap.  The surgeon will close the incisions with stitches. AFTER THE PROCEDURE  You will be taken to a recovery area. A nurse will watch  and check your progress.  When you are awake, feeling well, and taking fluids well, you may be allowed to go home. In some cases, you may need to stay overnight in the hospital.  Arrange for someone to drive you home. Document Released: 08/04/2008 Document Revised: 11/07/2011 Document Reviewed: 08/09/2011 [facility] Patient Information 2014 [place], [place].

## 2013-02-10 NOTE — Progress Notes (Signed)
Patient ID: Richard Torres, male   DOB: 12/12/1945, 67 y.o.   MRN: [id number]  Chief Complaint  Patient presents with  . New Evaluation    eval umb hernia    HPI Richard Torres is a 67 y.o. male.  He comes to see me for a small hernia at his umbilicus.  Richard Torres has had a robotic prostatectomy by Dr. Laverle Patter in the past and is doing well from that. He's had a laparoscopic cholecystectomy 15 years ago. He has incision above his umbilicus and one below his umbilicus. I performed bilateral inguinal hernia repairs in the past.  Significant comorbidities included he has had a minor cerebrovascular accident without residual seen only on MRI. He has hyperlipidemia, type 2 diabetes, hypertension. He developed superficial phlebitis this summer in June. Ultrasound showed deep venous thrombosis in the left leg, possible popliteal vein and in the left greater saphenous all the way up to the femoral vein. For that reason he was put on Xarelto, , a factor Xa blocker. He has not been told how long this will last. He is hoping for 6 months only.  He now states that for about 3 months he felt a little bit of a bulge at his umbilicus. No pain. No change in his bowel habits. No other hernias. Inguinal areas feel fine. He is exercising regularly. He works full-time as an Pensions consultant.  HPI  Past Medical History  Diagnosis Date  . DM (diabetes mellitus)   . HTN (hypertension)   . DJD (degenerative joint disease)   . Cerebrovascular disease   . Erectile dysfunction   . Dyslipidemia   . Prostate cancer   . Environmental allergies   . Clotting disorder   . Stroke     Past Surgical History  Procedure Laterality Date  . Prostatectomy    . Cholecystectomy    . Bilateral inguinal heria repairs    . Hernia repair    . Cholecystectomy      Family History  Problem Relation Age of Onset  . Cancer Brother     prostate  . Cancer Brother     prostate    Social History History  Substance Use Topics  . Smoking  status: Never Smoker   . Smokeless tobacco: Never Used  . Alcohol Use: Yes     Comment: very little    Allergies  Allergen Reactions  . Shellfish Allergy Other (See Comments)    lightheaded    Current Outpatient Prescriptions  Medication Sig Dispense Refill  . acetaminophen (TYLENOL) 325 MG tablet Take 650 mg by mouth every 6 (six) hours as needed.      . cholecalciferol (VITAMIN D) 1000 UNITS tablet Take 1,000 Units by mouth at bedtime.      . Cinnamon 500 MG TABS Take 1 tablet by mouth at bedtime.      . Fenofibrate (LIPOFEN) 150 MG CAPS Take by mouth.      . metoprolol tartrate (LOPRESSOR) 25 MG tablet Take 25 mg by mouth 2 (two) times daily.      . Rivaroxaban (XARELTO) 20 MG TABS tablet Take by mouth daily.      . rosuvastatin (CRESTOR) 20 MG tablet Take 10 mg by mouth daily.      . Saxagliptin-Metformin (KOMBIGLYZE XR) 09-998 MG TB24 Take 1 tablet by mouth at bedtime.      Marland Kitchen telmisartan (MICARDIS) 40 MG tablet Take 40 mg by mouth at bedtime.       No current facility-administered medications for this  visit.    Review of Systems Review of Systems  Constitutional: Negative for fever, chills and unexpected weight change.  HENT: Negative for hearing loss, congestion, sore throat, trouble swallowing and voice change.   Eyes: Negative for visual disturbance.  Respiratory: Negative for cough and wheezing.   Cardiovascular: Negative for chest pain, palpitations and leg swelling.  Gastrointestinal: Negative for nausea, vomiting, abdominal pain, diarrhea, constipation, blood in stool, abdominal distention, anal bleeding and rectal pain.  Genitourinary: Negative for hematuria and difficulty urinating.  Musculoskeletal: Negative for arthralgias.  Skin: Negative for rash and wound.  Neurological: Negative for seizures, syncope, weakness and headaches.  Hematological: Negative for adenopathy. Does not bruise/bleed easily.  Psychiatric/Behavioral: Negative for confusion.    Blood  pressure 104/66, pulse 60, temperature 97.3 F (36.3 C), temperature source Temporal, resp. rate 14, height 6\' 1"  (1.854 m), weight 184 lb 3.2 oz (83.553 kg).  Physical Exam Physical Exam  Constitutional: He is oriented to person, place, and time. He appears well-developed and well-nourished. No distress.  HENT:  Head: Normocephalic.  Nose: Nose normal.  Mouth/Throat: No oropharyngeal exudate.  Eyes: Conjunctivae and EOM are normal. Pupils are equal, round, and reactive to light. Right eye exhibits no discharge. Left eye exhibits no discharge. No scleral icterus.  Neck: Normal range of motion. Neck supple. No JVD present. No tracheal deviation present. No thyromegaly present.  Cardiovascular: Normal rate, regular rhythm, normal heart sounds and intact distal pulses.   No murmur heard. Pulmonary/Chest: Effort normal and breath sounds normal. No stridor. No respiratory distress. He has no wheezes. He has no rales. He exhibits no tenderness.  Vertical incision at the upper umbilical rim, and transverse incision at lower umbilical rim. Small reducible hernia located in the umbilicus. Other trocar sites well-healed. Inguinal hernia sites well healed.small diastases recti in the midline, above the umbilicus.  Abdominal: Soft. Bowel sounds are normal. He exhibits no distension and no mass. There is no tenderness. There is no rebound and no guarding.    Musculoskeletal: Normal range of motion. He exhibits no edema and no tenderness.  Lymphadenopathy:    He has no cervical adenopathy.  Neurological: He is alert and oriented to person, place, and time. He has normal reflexes. Coordination normal.  Skin: Skin is warm and dry. No rash noted. He is not diaphoretic. No erythema. No pallor.  Psychiatric: He has a normal mood and affect. His behavior is normal. Judgment and thought content normal.    Data Reviewed Mild old records. Medication list.  Assessment    Reducible, asymptomatic incisional  hernia at umbilicus.   Diastases recti, relatively small, asymptomatic.  Deep venous thrombosis, left lower extremity, on Xarelto since June  Status post bilateral inguinal hernia repairs, no recurrence  Status post robotic prostatectomy, doing well  Status post laparoscopic cholecystectomy  Type 2 diabetes mellitus  Hypertension  Moderate cerebral vascular accident and passed, and the residual    Plan    We decided to do nothing about his asymptomatic, periubmbilical incisional hernia for now, since he is on anticoagulation.  I discussed the natural history of this hernia. Told him that it may enlarge and become painful. Told him there was no immediate danger.  I told him that this would need to be repaired with mesh, possibly as an open procedure. Possibly laparoscopic.  He will call me back when he is off of anticoagulation when he is ready to have something done.        Angelia Mould. Derrell Lolling, M.D.,  [facility] General and Minimally invasive Surgery Breast and Colorectal Surgery Office:   [phone] Pager:   [phone]  02/10/2013, 10:43 AM

## 2014-06-23 DIAGNOSIS — I83813 Varicose veins of bilateral lower extremities with pain: Secondary | ICD-10-CM | POA: Diagnosis not present

## 2014-06-29 DIAGNOSIS — I8312 Varicose veins of left lower extremity with inflammation: Secondary | ICD-10-CM | POA: Diagnosis not present

## 2014-06-29 DIAGNOSIS — I83813 Varicose veins of bilateral lower extremities with pain: Secondary | ICD-10-CM | POA: Diagnosis not present

## 2014-08-18 DIAGNOSIS — E119 Type 2 diabetes mellitus without complications: Secondary | ICD-10-CM | POA: Diagnosis not present

## 2014-08-18 DIAGNOSIS — I10 Essential (primary) hypertension: Secondary | ICD-10-CM | POA: Diagnosis not present

## 2014-12-08 DIAGNOSIS — Z09 Encounter for follow-up examination after completed treatment for conditions other than malignant neoplasm: Secondary | ICD-10-CM | POA: Diagnosis not present

## 2014-12-08 DIAGNOSIS — K640 First degree hemorrhoids: Secondary | ICD-10-CM | POA: Diagnosis not present

## 2014-12-08 DIAGNOSIS — Z86010 Personal history of colonic polyps: Secondary | ICD-10-CM | POA: Diagnosis not present

## 2015-03-17 DIAGNOSIS — Z125 Encounter for screening for malignant neoplasm of prostate: Secondary | ICD-10-CM | POA: Diagnosis not present

## 2015-03-17 DIAGNOSIS — E119 Type 2 diabetes mellitus without complications: Secondary | ICD-10-CM | POA: Diagnosis not present

## 2015-03-17 DIAGNOSIS — I10 Essential (primary) hypertension: Secondary | ICD-10-CM | POA: Diagnosis not present

## 2015-03-17 DIAGNOSIS — Z23 Encounter for immunization: Secondary | ICD-10-CM | POA: Diagnosis not present

## 2015-03-17 DIAGNOSIS — F341 Dysthymic disorder: Secondary | ICD-10-CM | POA: Diagnosis not present

## 2015-03-17 DIAGNOSIS — M1990 Unspecified osteoarthritis, unspecified site: Secondary | ICD-10-CM | POA: Diagnosis not present

## 2015-03-17 DIAGNOSIS — K635 Polyp of colon: Secondary | ICD-10-CM | POA: Diagnosis not present

## 2015-03-17 DIAGNOSIS — Z79899 Other long term (current) drug therapy: Secondary | ICD-10-CM | POA: Diagnosis not present

## 2015-03-17 DIAGNOSIS — Z0001 Encounter for general adult medical examination with abnormal findings: Secondary | ICD-10-CM | POA: Diagnosis not present

## 2015-03-17 DIAGNOSIS — N183 Chronic kidney disease, stage 3 (moderate): Secondary | ICD-10-CM | POA: Diagnosis not present

## 2015-03-17 DIAGNOSIS — E1140 Type 2 diabetes mellitus with diabetic neuropathy, unspecified: Secondary | ICD-10-CM | POA: Diagnosis not present

## 2015-03-17 DIAGNOSIS — I679 Cerebrovascular disease, unspecified: Secondary | ICD-10-CM | POA: Diagnosis not present

## 2015-06-16 ENCOUNTER — Encounter: Payer: Medicare Other | Attending: Internal Medicine | Admitting: *Deleted

## 2015-06-16 ENCOUNTER — Encounter: Payer: Self-pay | Admitting: *Deleted

## 2015-06-16 VITALS — Ht 74.0 in | Wt 185.8 lb

## 2015-06-16 DIAGNOSIS — E119 Type 2 diabetes mellitus without complications: Secondary | ICD-10-CM | POA: Diagnosis not present

## 2015-06-16 NOTE — Patient Instructions (Signed)
Plan:  Aim for 3 Carb Choices per meal (45 grams) +/- 1 either way  Aim for 0-15 Carbs per snack if hungry  Include protein in moderation with your meals and snacks Consider reading food labels for Total Carbohydrate and Fat Grams of foods Consider  increasing your activity level by walking for 30 minutes daily as tolerated -  GOAL 150 minutes per week Consider checking BG at alternate times per day as directed by MD  Continue taking medication  as directed by MD  [employer] Shark butter substitute [employer] Protein Bar Kind Bars [place] Balanced Breaks Special K Protein Cereal  CARBS + PROTEIN = :-)

## 2015-06-24 ENCOUNTER — Encounter: Payer: Self-pay | Admitting: *Deleted

## 2015-06-24 NOTE — Progress Notes (Signed)
Diabetes Self-Management Education  Visit Type: First/Initial  Appt. Start Time: 0800 Appt. End Time: 0930  06/24/2015  Mr. Richard Torres, identified by name and date of birth, is a 70 y.o. male with a diagnosis of Diabetes: Type 2. Mr. Richard Torres presents for DSME accompanied by his wife Richard Torres. He was taking Kombaglyze but refused to pay to co-pay.  ASSESSMENT  Height 6\' 2"  (1.88 m), weight 185 lb 12.8 oz (84.278 kg). Body mass index is 23.85 kg/(m^2).      Diabetes Self-Management Education - 06/24/15 1535    Visit Information   Visit Type First/Initial   Initial Visit   Diabetes Type Type 2   Are you currently following a meal plan? No   Are you taking your medications as prescribed? Yes   Health Coping   How would you rate your overall health? Fair   Psychosocial Assessment   Patient Belief/Attitude about Diabetes Motivated to manage diabetes   Self-management support Doctor's office;Family;CDE visits   Other persons present Patient;Spouse/SO  Wife Richard Torres   Patient Concerns Nutrition/Meal planning;Medication;Monitoring;Glycemic Control   Special Needs None   Preferred Learning Style No preference indicated   Learning Readiness Change in progress   How often do you need to have someone help you when you read instructions, pamphlets, or other written materials from your doctor or pharmacy? 2 - Rarely   Complications   Last HgB A1C per patient/outside source 6.7 %   How often do you check your blood sugar? 1-2 times/day   Fasting Blood glucose range (mg/dL) 70-129   Postprandial Blood glucose range (mg/dL) 70-129;130-179  110-160   Have you had a dilated eye exam in the past 12 months? Yes   Have you had a dental exam in the past 12 months? Yes   Are you checking your feet? Yes   How many days per week are you checking your feet? 7   Dietary Intake   Snack (evening) Peanut butter cups, cheese, chcolate, ice cream   Exercise   Exercise Type ADL's   Patient Education   Previous Diabetes Education Yes (please comment)   Disease state  Definition of diabetes, type 1 and 2, and the diagnosis of diabetes;Factors that contribute to the development of diabetes   Nutrition management  Role of diet in the treatment of diabetes and the relationship between the three main macronutrients and blood glucose level;Food label reading, portion sizes and measuring food.;Carbohydrate counting;Information on hints to eating out and maintain blood glucose control.   Physical activity and exercise  Role of exercise on diabetes management, blood pressure control and cardiac health.   Medications Reviewed patients medication for diabetes, action, purpose, timing of dose and side effects.   Monitoring Purpose and frequency of SMBG.   Chronic complications Relationship between chronic complications and blood glucose control   Psychosocial adjustment Role of stress on diabetes   Individualized Goals (developed by patient)   Nutrition General guidelines for healthy choices and portions discussed   Physical Activity Exercise 3-5 times per week;30 minutes per day   Medications take my medication as prescribed   Monitoring  test my blood glucose as discussed   Reducing Risk increase portions of nuts and seeds   Outcomes   Expected Outcomes Demonstrated interest in learning. Expect positive outcomes   Future DMSE PRN   Program Status Not Completed      Individualized Plan for Diabetes Self-Management Training:   Learning Objective:  Patient will have a greater understanding of diabetes self-management.  Patient education plan is to attend individual and/or group sessions per assessed needs and concerns.   Plan:   Patient Instructions  Plan:  Aim for 3 Carb Choices per meal (45 grams) +/- 1 either way  Aim for 0-15 Carbs per snack if hungry  Include protein in moderation with your meals and snacks Consider reading food labels for Total Carbohydrate and Fat Grams of foods Consider   increasing your activity level by walking for 30 minutes daily as tolerated -  GOAL 150 minutes per week Consider checking BG at alternate times per day as directed by MD  Continue taking medication  as directed by MD  Richard Torres  CARBS + PROTEIN = :-)   Expected Outcomes:  Demonstrated interest in learning. Expect positive outcomes  Education material provided: Living Well with Diabetes, A1C conversion sheet, Meal plan card, My Plate and Snack sheet  If problems or questions, patient to contact team via:  Phone  Future DSME appointment: PRN

## 2015-06-29 DIAGNOSIS — I8312 Varicose veins of left lower extremity with inflammation: Secondary | ICD-10-CM | POA: Diagnosis not present

## 2015-06-29 DIAGNOSIS — I8311 Varicose veins of right lower extremity with inflammation: Secondary | ICD-10-CM | POA: Diagnosis not present

## 2015-09-15 DIAGNOSIS — I8393 Asymptomatic varicose veins of bilateral lower extremities: Secondary | ICD-10-CM | POA: Diagnosis not present

## 2015-09-15 DIAGNOSIS — F341 Dysthymic disorder: Secondary | ICD-10-CM | POA: Diagnosis not present

## 2015-09-15 DIAGNOSIS — N529 Male erectile dysfunction, unspecified: Secondary | ICD-10-CM | POA: Diagnosis not present

## 2015-09-15 DIAGNOSIS — E1140 Type 2 diabetes mellitus with diabetic neuropathy, unspecified: Secondary | ICD-10-CM | POA: Diagnosis not present

## 2015-09-15 DIAGNOSIS — I679 Cerebrovascular disease, unspecified: Secondary | ICD-10-CM | POA: Diagnosis not present

## 2015-09-15 DIAGNOSIS — E119 Type 2 diabetes mellitus without complications: Secondary | ICD-10-CM | POA: Diagnosis not present

## 2015-09-15 DIAGNOSIS — M1990 Unspecified osteoarthritis, unspecified site: Secondary | ICD-10-CM | POA: Diagnosis not present

## 2015-09-15 DIAGNOSIS — I10 Essential (primary) hypertension: Secondary | ICD-10-CM | POA: Diagnosis not present

## 2015-09-15 DIAGNOSIS — K429 Umbilical hernia without obstruction or gangrene: Secondary | ICD-10-CM | POA: Diagnosis not present

## 2015-09-15 DIAGNOSIS — Z7984 Long term (current) use of oral hypoglycemic drugs: Secondary | ICD-10-CM | POA: Diagnosis not present

## 2015-09-15 DIAGNOSIS — N183 Chronic kidney disease, stage 3 (moderate): Secondary | ICD-10-CM | POA: Diagnosis not present

## 2015-09-15 DIAGNOSIS — G4700 Insomnia, unspecified: Secondary | ICD-10-CM | POA: Diagnosis not present

## 2016-02-11 DIAGNOSIS — Z23 Encounter for immunization: Secondary | ICD-10-CM | POA: Diagnosis not present

## 2016-02-14 DIAGNOSIS — M549 Dorsalgia, unspecified: Secondary | ICD-10-CM | POA: Diagnosis not present

## 2016-02-14 DIAGNOSIS — Z7984 Long term (current) use of oral hypoglycemic drugs: Secondary | ICD-10-CM | POA: Diagnosis not present

## 2016-02-14 DIAGNOSIS — E119 Type 2 diabetes mellitus without complications: Secondary | ICD-10-CM | POA: Diagnosis not present

## 2016-02-14 DIAGNOSIS — E1165 Type 2 diabetes mellitus with hyperglycemia: Secondary | ICD-10-CM | POA: Diagnosis not present

## 2016-04-18 DIAGNOSIS — J209 Acute bronchitis, unspecified: Secondary | ICD-10-CM | POA: Diagnosis not present

## 2016-04-18 DIAGNOSIS — M1990 Unspecified osteoarthritis, unspecified site: Secondary | ICD-10-CM | POA: Diagnosis not present

## 2016-04-18 DIAGNOSIS — Z0001 Encounter for general adult medical examination with abnormal findings: Secondary | ICD-10-CM | POA: Diagnosis not present

## 2016-04-18 DIAGNOSIS — N183 Chronic kidney disease, stage 3 (moderate): Secondary | ICD-10-CM | POA: Diagnosis not present

## 2016-04-18 DIAGNOSIS — E1140 Type 2 diabetes mellitus with diabetic neuropathy, unspecified: Secondary | ICD-10-CM | POA: Diagnosis not present

## 2016-04-18 DIAGNOSIS — G4700 Insomnia, unspecified: Secondary | ICD-10-CM | POA: Diagnosis not present

## 2016-04-18 DIAGNOSIS — N529 Male erectile dysfunction, unspecified: Secondary | ICD-10-CM | POA: Diagnosis not present

## 2016-04-18 DIAGNOSIS — Z125 Encounter for screening for malignant neoplasm of prostate: Secondary | ICD-10-CM | POA: Diagnosis not present

## 2016-04-18 DIAGNOSIS — Z8546 Personal history of malignant neoplasm of prostate: Secondary | ICD-10-CM | POA: Diagnosis not present

## 2016-04-18 DIAGNOSIS — Z79899 Other long term (current) drug therapy: Secondary | ICD-10-CM | POA: Diagnosis not present

## 2016-04-18 DIAGNOSIS — Z1389 Encounter for screening for other disorder: Secondary | ICD-10-CM | POA: Diagnosis not present

## 2016-04-18 DIAGNOSIS — I679 Cerebrovascular disease, unspecified: Secondary | ICD-10-CM | POA: Diagnosis not present

## 2016-04-18 DIAGNOSIS — Z7984 Long term (current) use of oral hypoglycemic drugs: Secondary | ICD-10-CM | POA: Diagnosis not present

## 2016-04-18 DIAGNOSIS — E119 Type 2 diabetes mellitus without complications: Secondary | ICD-10-CM | POA: Diagnosis not present

## 2016-04-18 DIAGNOSIS — E1165 Type 2 diabetes mellitus with hyperglycemia: Secondary | ICD-10-CM | POA: Diagnosis not present

## 2016-04-18 DIAGNOSIS — I8393 Asymptomatic varicose veins of bilateral lower extremities: Secondary | ICD-10-CM | POA: Diagnosis not present

## 2016-04-18 DIAGNOSIS — M549 Dorsalgia, unspecified: Secondary | ICD-10-CM | POA: Diagnosis not present

## 2016-08-10 DIAGNOSIS — I8312 Varicose veins of left lower extremity with inflammation: Secondary | ICD-10-CM | POA: Diagnosis not present

## 2016-08-10 DIAGNOSIS — I8311 Varicose veins of right lower extremity with inflammation: Secondary | ICD-10-CM | POA: Diagnosis not present

## 2016-08-29 DIAGNOSIS — H25813 Combined forms of age-related cataract, bilateral: Secondary | ICD-10-CM | POA: Diagnosis not present

## 2016-08-29 DIAGNOSIS — H52223 Regular astigmatism, bilateral: Secondary | ICD-10-CM | POA: Diagnosis not present

## 2016-08-29 DIAGNOSIS — E119 Type 2 diabetes mellitus without complications: Secondary | ICD-10-CM | POA: Diagnosis not present

## 2016-08-29 DIAGNOSIS — H35033 Hypertensive retinopathy, bilateral: Secondary | ICD-10-CM | POA: Diagnosis not present

## 2016-08-29 DIAGNOSIS — H524 Presbyopia: Secondary | ICD-10-CM | POA: Diagnosis not present

## 2016-08-29 DIAGNOSIS — H5213 Myopia, bilateral: Secondary | ICD-10-CM | POA: Diagnosis not present

## 2016-10-04 DIAGNOSIS — J209 Acute bronchitis, unspecified: Secondary | ICD-10-CM | POA: Diagnosis not present

## 2016-10-23 DIAGNOSIS — E1140 Type 2 diabetes mellitus with diabetic neuropathy, unspecified: Secondary | ICD-10-CM | POA: Diagnosis not present

## 2016-10-23 DIAGNOSIS — Z7984 Long term (current) use of oral hypoglycemic drugs: Secondary | ICD-10-CM | POA: Diagnosis not present

## 2016-10-23 DIAGNOSIS — N183 Chronic kidney disease, stage 3 (moderate): Secondary | ICD-10-CM | POA: Diagnosis not present

## 2016-10-23 DIAGNOSIS — M1990 Unspecified osteoarthritis, unspecified site: Secondary | ICD-10-CM | POA: Diagnosis not present

## 2016-10-23 DIAGNOSIS — J209 Acute bronchitis, unspecified: Secondary | ICD-10-CM | POA: Diagnosis not present

## 2016-10-23 DIAGNOSIS — E119 Type 2 diabetes mellitus without complications: Secondary | ICD-10-CM | POA: Diagnosis not present

## 2016-10-23 DIAGNOSIS — Z8546 Personal history of malignant neoplasm of prostate: Secondary | ICD-10-CM | POA: Diagnosis not present

## 2016-10-23 DIAGNOSIS — I8393 Asymptomatic varicose veins of bilateral lower extremities: Secondary | ICD-10-CM | POA: Diagnosis not present

## 2016-10-23 DIAGNOSIS — I679 Cerebrovascular disease, unspecified: Secondary | ICD-10-CM | POA: Diagnosis not present

## 2016-10-23 DIAGNOSIS — G4700 Insomnia, unspecified: Secondary | ICD-10-CM | POA: Diagnosis not present

## 2016-10-23 DIAGNOSIS — F341 Dysthymic disorder: Secondary | ICD-10-CM | POA: Diagnosis not present

## 2016-10-23 DIAGNOSIS — N529 Male erectile dysfunction, unspecified: Secondary | ICD-10-CM | POA: Diagnosis not present

## 2016-11-09 DIAGNOSIS — J069 Acute upper respiratory infection, unspecified: Secondary | ICD-10-CM | POA: Diagnosis not present

## 2016-11-09 DIAGNOSIS — J209 Acute bronchitis, unspecified: Secondary | ICD-10-CM | POA: Diagnosis not present

## 2016-12-20 DIAGNOSIS — I83891 Varicose veins of right lower extremities with other complications: Secondary | ICD-10-CM | POA: Diagnosis not present

## 2016-12-20 DIAGNOSIS — I8311 Varicose veins of right lower extremity with inflammation: Secondary | ICD-10-CM | POA: Diagnosis not present

## 2017-01-02 DIAGNOSIS — M7981 Nontraumatic hematoma of soft tissue: Secondary | ICD-10-CM | POA: Diagnosis not present

## 2017-01-02 DIAGNOSIS — I83811 Varicose veins of right lower extremities with pain: Secondary | ICD-10-CM | POA: Diagnosis not present

## 2017-01-03 ENCOUNTER — Encounter (HOSPITAL_COMMUNITY): Payer: Self-pay

## 2017-01-03 DIAGNOSIS — R51 Headache: Secondary | ICD-10-CM | POA: Insufficient documentation

## 2017-01-03 DIAGNOSIS — Z5321 Procedure and treatment not carried out due to patient leaving prior to being seen by health care provider: Secondary | ICD-10-CM | POA: Diagnosis not present

## 2017-01-03 NOTE — ED Triage Notes (Signed)
Pt complains of a headache for a week, he states it gets worse when he lays down He also states that he's not over a sinus infection that was dx in May

## 2017-01-04 ENCOUNTER — Emergency Department (HOSPITAL_COMMUNITY)
Admission: EM | Admit: 2017-01-04 | Discharge: 2017-01-04 | Disposition: A | Discharge status: Home / Self Care | Payer: Medicare Other | Attending: Emergency Medicine | Admitting: Emergency Medicine

## 2017-01-04 NOTE — ED Notes (Signed)
Pt sts they will just go home and follow up with primary care doc.

## 2017-01-04 NOTE — ED Notes (Signed)
Bed: [facility]1 Expected date:  Expected time:  Means of arrival:  Comments: 

## 2017-01-08 DIAGNOSIS — R05 Cough: Secondary | ICD-10-CM | POA: Diagnosis not present

## 2017-01-08 DIAGNOSIS — J209 Acute bronchitis, unspecified: Secondary | ICD-10-CM | POA: Diagnosis not present

## 2017-01-16 DIAGNOSIS — I8312 Varicose veins of left lower extremity with inflammation: Secondary | ICD-10-CM | POA: Diagnosis not present

## 2017-01-16 DIAGNOSIS — I83812 Varicose veins of left lower extremities with pain: Secondary | ICD-10-CM | POA: Diagnosis not present

## 2017-01-18 DIAGNOSIS — H5213 Myopia, bilateral: Secondary | ICD-10-CM | POA: Diagnosis not present

## 2017-01-18 DIAGNOSIS — H524 Presbyopia: Secondary | ICD-10-CM | POA: Diagnosis not present

## 2017-01-18 DIAGNOSIS — H35033 Hypertensive retinopathy, bilateral: Secondary | ICD-10-CM | POA: Diagnosis not present

## 2017-01-18 DIAGNOSIS — E119 Type 2 diabetes mellitus without complications: Secondary | ICD-10-CM | POA: Diagnosis not present

## 2017-01-18 DIAGNOSIS — H25813 Combined forms of age-related cataract, bilateral: Secondary | ICD-10-CM | POA: Diagnosis not present

## 2017-01-18 DIAGNOSIS — H52223 Regular astigmatism, bilateral: Secondary | ICD-10-CM | POA: Diagnosis not present

## 2017-02-06 DIAGNOSIS — I83812 Varicose veins of left lower extremities with pain: Secondary | ICD-10-CM | POA: Diagnosis not present

## 2017-02-06 DIAGNOSIS — I8312 Varicose veins of left lower extremity with inflammation: Secondary | ICD-10-CM | POA: Diagnosis not present

## 2017-02-06 DIAGNOSIS — M7981 Nontraumatic hematoma of soft tissue: Secondary | ICD-10-CM | POA: Diagnosis not present

## 2017-02-15 ENCOUNTER — Other Ambulatory Visit: Payer: Self-pay | Admitting: Allergy and Immunology

## 2017-02-15 ENCOUNTER — Ambulatory Visit
Admission: RE | Admit: 2017-02-15 | Discharge: 2017-02-15 | Disposition: A | Discharge status: Home / Self Care | Payer: Medicare Other | Source: Ambulatory Visit | Attending: Allergy and Immunology | Admitting: Allergy and Immunology

## 2017-02-15 DIAGNOSIS — J3089 Other allergic rhinitis: Secondary | ICD-10-CM | POA: Diagnosis not present

## 2017-02-15 DIAGNOSIS — J4540 Moderate persistent asthma, uncomplicated: Secondary | ICD-10-CM | POA: Diagnosis not present

## 2017-02-15 DIAGNOSIS — J301 Allergic rhinitis due to pollen: Secondary | ICD-10-CM | POA: Diagnosis not present

## 2017-02-15 DIAGNOSIS — R0989 Other specified symptoms and signs involving the circulatory and respiratory systems: Secondary | ICD-10-CM | POA: Diagnosis not present

## 2017-02-15 DIAGNOSIS — J3081 Allergic rhinitis due to animal (cat) (dog) hair and dander: Secondary | ICD-10-CM | POA: Diagnosis not present

## 2017-02-16 DIAGNOSIS — M1990 Unspecified osteoarthritis, unspecified site: Secondary | ICD-10-CM | POA: Diagnosis not present

## 2017-02-16 DIAGNOSIS — F341 Dysthymic disorder: Secondary | ICD-10-CM | POA: Diagnosis not present

## 2017-02-16 DIAGNOSIS — I10 Essential (primary) hypertension: Secondary | ICD-10-CM | POA: Diagnosis not present

## 2017-02-16 DIAGNOSIS — C61 Malignant neoplasm of prostate: Secondary | ICD-10-CM | POA: Diagnosis not present

## 2017-02-16 DIAGNOSIS — E119 Type 2 diabetes mellitus without complications: Secondary | ICD-10-CM | POA: Diagnosis not present

## 2017-02-16 DIAGNOSIS — Z7984 Long term (current) use of oral hypoglycemic drugs: Secondary | ICD-10-CM | POA: Diagnosis not present

## 2017-02-16 DIAGNOSIS — N183 Chronic kidney disease, stage 3 (moderate): Secondary | ICD-10-CM | POA: Diagnosis not present

## 2017-02-16 DIAGNOSIS — E782 Mixed hyperlipidemia: Secondary | ICD-10-CM | POA: Diagnosis not present

## 2017-02-16 DIAGNOSIS — E1140 Type 2 diabetes mellitus with diabetic neuropathy, unspecified: Secondary | ICD-10-CM | POA: Diagnosis not present

## 2017-02-16 DIAGNOSIS — Z8546 Personal history of malignant neoplasm of prostate: Secondary | ICD-10-CM | POA: Diagnosis not present

## 2017-02-22 DIAGNOSIS — C61 Malignant neoplasm of prostate: Secondary | ICD-10-CM | POA: Diagnosis not present

## 2017-02-22 DIAGNOSIS — F341 Dysthymic disorder: Secondary | ICD-10-CM | POA: Diagnosis not present

## 2017-02-22 DIAGNOSIS — Z8546 Personal history of malignant neoplasm of prostate: Secondary | ICD-10-CM | POA: Diagnosis not present

## 2017-02-22 DIAGNOSIS — E119 Type 2 diabetes mellitus without complications: Secondary | ICD-10-CM | POA: Diagnosis not present

## 2017-02-22 DIAGNOSIS — M1990 Unspecified osteoarthritis, unspecified site: Secondary | ICD-10-CM | POA: Diagnosis not present

## 2017-02-22 DIAGNOSIS — E782 Mixed hyperlipidemia: Secondary | ICD-10-CM | POA: Diagnosis not present

## 2017-02-22 DIAGNOSIS — I10 Essential (primary) hypertension: Secondary | ICD-10-CM | POA: Diagnosis not present

## 2017-02-22 DIAGNOSIS — E1140 Type 2 diabetes mellitus with diabetic neuropathy, unspecified: Secondary | ICD-10-CM | POA: Diagnosis not present

## 2017-02-22 DIAGNOSIS — N183 Chronic kidney disease, stage 3 (moderate): Secondary | ICD-10-CM | POA: Diagnosis not present

## 2017-02-25 DIAGNOSIS — Z23 Encounter for immunization: Secondary | ICD-10-CM | POA: Diagnosis not present

## 2017-04-04 DIAGNOSIS — J029 Acute pharyngitis, unspecified: Secondary | ICD-10-CM | POA: Diagnosis not present

## 2017-04-26 DIAGNOSIS — E782 Mixed hyperlipidemia: Secondary | ICD-10-CM | POA: Diagnosis not present

## 2017-04-26 DIAGNOSIS — I10 Essential (primary) hypertension: Secondary | ICD-10-CM | POA: Diagnosis not present

## 2017-04-26 DIAGNOSIS — M1990 Unspecified osteoarthritis, unspecified site: Secondary | ICD-10-CM | POA: Diagnosis not present

## 2017-04-26 DIAGNOSIS — F341 Dysthymic disorder: Secondary | ICD-10-CM | POA: Diagnosis not present

## 2017-04-26 DIAGNOSIS — Z1389 Encounter for screening for other disorder: Secondary | ICD-10-CM | POA: Diagnosis not present

## 2017-04-26 DIAGNOSIS — Z79899 Other long term (current) drug therapy: Secondary | ICD-10-CM | POA: Diagnosis not present

## 2017-04-26 DIAGNOSIS — N183 Chronic kidney disease, stage 3 (moderate): Secondary | ICD-10-CM | POA: Diagnosis not present

## 2017-04-26 DIAGNOSIS — Z125 Encounter for screening for malignant neoplasm of prostate: Secondary | ICD-10-CM | POA: Diagnosis not present

## 2017-04-26 DIAGNOSIS — E1165 Type 2 diabetes mellitus with hyperglycemia: Secondary | ICD-10-CM | POA: Diagnosis not present

## 2017-04-26 DIAGNOSIS — Z0000 Encounter for general adult medical examination without abnormal findings: Secondary | ICD-10-CM | POA: Diagnosis not present

## 2017-06-07 DIAGNOSIS — H25813 Combined forms of age-related cataract, bilateral: Secondary | ICD-10-CM | POA: Diagnosis not present

## 2017-06-07 DIAGNOSIS — H35033 Hypertensive retinopathy, bilateral: Secondary | ICD-10-CM | POA: Diagnosis not present

## 2017-06-07 DIAGNOSIS — H524 Presbyopia: Secondary | ICD-10-CM | POA: Diagnosis not present

## 2017-06-07 DIAGNOSIS — E119 Type 2 diabetes mellitus without complications: Secondary | ICD-10-CM | POA: Diagnosis not present

## 2017-06-28 DIAGNOSIS — I8311 Varicose veins of right lower extremity with inflammation: Secondary | ICD-10-CM | POA: Diagnosis not present

## 2017-06-28 DIAGNOSIS — I8312 Varicose veins of left lower extremity with inflammation: Secondary | ICD-10-CM | POA: Diagnosis not present

## 2017-07-02 DIAGNOSIS — H2511 Age-related nuclear cataract, right eye: Secondary | ICD-10-CM | POA: Diagnosis not present

## 2017-07-16 DIAGNOSIS — H2511 Age-related nuclear cataract, right eye: Secondary | ICD-10-CM | POA: Diagnosis not present

## 2017-07-16 DIAGNOSIS — H25811 Combined forms of age-related cataract, right eye: Secondary | ICD-10-CM | POA: Diagnosis not present

## 2017-07-17 DIAGNOSIS — H2512 Age-related nuclear cataract, left eye: Secondary | ICD-10-CM | POA: Diagnosis not present

## 2017-07-23 DIAGNOSIS — H2512 Age-related nuclear cataract, left eye: Secondary | ICD-10-CM | POA: Diagnosis not present

## 2017-07-23 DIAGNOSIS — H25812 Combined forms of age-related cataract, left eye: Secondary | ICD-10-CM | POA: Diagnosis not present

## 2017-09-24 DIAGNOSIS — R319 Hematuria, unspecified: Secondary | ICD-10-CM | POA: Diagnosis not present

## 2017-09-24 DIAGNOSIS — T753XXA Motion sickness, initial encounter: Secondary | ICD-10-CM | POA: Diagnosis not present

## 2017-09-24 DIAGNOSIS — R82998 Other abnormal findings in urine: Secondary | ICD-10-CM | POA: Diagnosis not present

## 2017-12-07 DIAGNOSIS — E119 Type 2 diabetes mellitus without complications: Secondary | ICD-10-CM | POA: Diagnosis not present

## 2017-12-07 DIAGNOSIS — N183 Chronic kidney disease, stage 3 (moderate): Secondary | ICD-10-CM | POA: Diagnosis not present

## 2017-12-07 DIAGNOSIS — Z79899 Other long term (current) drug therapy: Secondary | ICD-10-CM | POA: Diagnosis not present

## 2017-12-07 DIAGNOSIS — I10 Essential (primary) hypertension: Secondary | ICD-10-CM | POA: Diagnosis not present

## 2017-12-07 DIAGNOSIS — E782 Mixed hyperlipidemia: Secondary | ICD-10-CM | POA: Diagnosis not present

## 2017-12-07 DIAGNOSIS — R195 Other fecal abnormalities: Secondary | ICD-10-CM | POA: Diagnosis not present

## 2018-01-07 DIAGNOSIS — N183 Chronic kidney disease, stage 3 (moderate): Secondary | ICD-10-CM | POA: Diagnosis not present

## 2018-01-07 DIAGNOSIS — I10 Essential (primary) hypertension: Secondary | ICD-10-CM | POA: Diagnosis not present

## 2018-01-07 DIAGNOSIS — E119 Type 2 diabetes mellitus without complications: Secondary | ICD-10-CM | POA: Diagnosis not present

## 2018-01-07 DIAGNOSIS — M1990 Unspecified osteoarthritis, unspecified site: Secondary | ICD-10-CM | POA: Diagnosis not present

## 2018-01-07 DIAGNOSIS — Z8546 Personal history of malignant neoplasm of prostate: Secondary | ICD-10-CM | POA: Diagnosis not present

## 2018-01-07 DIAGNOSIS — E1140 Type 2 diabetes mellitus with diabetic neuropathy, unspecified: Secondary | ICD-10-CM | POA: Diagnosis not present

## 2018-01-07 DIAGNOSIS — C61 Malignant neoplasm of prostate: Secondary | ICD-10-CM | POA: Diagnosis not present

## 2018-01-07 DIAGNOSIS — F341 Dysthymic disorder: Secondary | ICD-10-CM | POA: Diagnosis not present

## 2018-01-07 DIAGNOSIS — E782 Mixed hyperlipidemia: Secondary | ICD-10-CM | POA: Diagnosis not present

## 2018-01-28 DIAGNOSIS — I10 Essential (primary) hypertension: Secondary | ICD-10-CM | POA: Diagnosis not present

## 2018-01-28 DIAGNOSIS — Z8546 Personal history of malignant neoplasm of prostate: Secondary | ICD-10-CM | POA: Diagnosis not present

## 2018-01-28 DIAGNOSIS — N183 Chronic kidney disease, stage 3 (moderate): Secondary | ICD-10-CM | POA: Diagnosis not present

## 2018-01-28 DIAGNOSIS — Z7984 Long term (current) use of oral hypoglycemic drugs: Secondary | ICD-10-CM | POA: Diagnosis not present

## 2018-01-28 DIAGNOSIS — E1140 Type 2 diabetes mellitus with diabetic neuropathy, unspecified: Secondary | ICD-10-CM | POA: Diagnosis not present

## 2018-01-28 DIAGNOSIS — F341 Dysthymic disorder: Secondary | ICD-10-CM | POA: Diagnosis not present

## 2018-01-28 DIAGNOSIS — E782 Mixed hyperlipidemia: Secondary | ICD-10-CM | POA: Diagnosis not present

## 2018-01-28 DIAGNOSIS — M1990 Unspecified osteoarthritis, unspecified site: Secondary | ICD-10-CM | POA: Diagnosis not present

## 2018-02-05 DIAGNOSIS — M545 Low back pain: Secondary | ICD-10-CM | POA: Diagnosis not present

## 2018-02-05 DIAGNOSIS — Z23 Encounter for immunization: Secondary | ICD-10-CM | POA: Diagnosis not present

## 2018-03-14 ENCOUNTER — Encounter: Payer: Self-pay | Admitting: General Surgery

## 2018-03-14 DIAGNOSIS — Z8719 Personal history of other diseases of the digestive system: Secondary | ICD-10-CM | POA: Diagnosis not present

## 2018-03-14 DIAGNOSIS — E119 Type 2 diabetes mellitus without complications: Secondary | ICD-10-CM | POA: Diagnosis not present

## 2018-03-14 DIAGNOSIS — E785 Hyperlipidemia, unspecified: Secondary | ICD-10-CM | POA: Diagnosis not present

## 2018-03-14 DIAGNOSIS — Z8673 Personal history of transient ischemic attack (TIA), and cerebral infarction without residual deficits: Secondary | ICD-10-CM | POA: Diagnosis not present

## 2018-03-14 DIAGNOSIS — Z9889 Other specified postprocedural states: Secondary | ICD-10-CM | POA: Diagnosis not present

## 2018-03-14 DIAGNOSIS — Z8042 Family history of malignant neoplasm of prostate: Secondary | ICD-10-CM | POA: Diagnosis not present

## 2018-03-14 DIAGNOSIS — Z9079 Acquired absence of other genital organ(s): Secondary | ICD-10-CM | POA: Diagnosis not present

## 2018-03-14 DIAGNOSIS — Z9049 Acquired absence of other specified parts of digestive tract: Secondary | ICD-10-CM | POA: Diagnosis not present

## 2018-03-14 DIAGNOSIS — K432 Incisional hernia without obstruction or gangrene: Secondary | ICD-10-CM | POA: Diagnosis not present

## 2018-03-14 DIAGNOSIS — Z86718 Personal history of other venous thrombosis and embolism: Secondary | ICD-10-CM | POA: Diagnosis not present

## 2018-03-19 DIAGNOSIS — H01001 Unspecified blepharitis right upper eyelid: Secondary | ICD-10-CM | POA: Diagnosis not present

## 2018-03-19 DIAGNOSIS — H26493 Other secondary cataract, bilateral: Secondary | ICD-10-CM | POA: Diagnosis not present

## 2018-03-19 DIAGNOSIS — H35033 Hypertensive retinopathy, bilateral: Secondary | ICD-10-CM | POA: Diagnosis not present

## 2018-03-19 DIAGNOSIS — E119 Type 2 diabetes mellitus without complications: Secondary | ICD-10-CM | POA: Diagnosis not present

## 2018-04-03 DIAGNOSIS — C61 Malignant neoplasm of prostate: Secondary | ICD-10-CM | POA: Diagnosis not present

## 2018-04-03 DIAGNOSIS — I10 Essential (primary) hypertension: Secondary | ICD-10-CM | POA: Diagnosis not present

## 2018-04-03 DIAGNOSIS — N183 Chronic kidney disease, stage 3 (moderate): Secondary | ICD-10-CM | POA: Diagnosis not present

## 2018-04-03 DIAGNOSIS — M1990 Unspecified osteoarthritis, unspecified site: Secondary | ICD-10-CM | POA: Diagnosis not present

## 2018-04-03 DIAGNOSIS — E782 Mixed hyperlipidemia: Secondary | ICD-10-CM | POA: Diagnosis not present

## 2018-04-03 DIAGNOSIS — Z8546 Personal history of malignant neoplasm of prostate: Secondary | ICD-10-CM | POA: Diagnosis not present

## 2018-04-03 DIAGNOSIS — F341 Dysthymic disorder: Secondary | ICD-10-CM | POA: Diagnosis not present

## 2018-04-03 DIAGNOSIS — E1140 Type 2 diabetes mellitus with diabetic neuropathy, unspecified: Secondary | ICD-10-CM | POA: Diagnosis not present

## 2018-04-03 DIAGNOSIS — E119 Type 2 diabetes mellitus without complications: Secondary | ICD-10-CM | POA: Diagnosis not present

## 2018-04-09 DIAGNOSIS — H01001 Unspecified blepharitis right upper eyelid: Secondary | ICD-10-CM | POA: Diagnosis not present

## 2018-04-09 DIAGNOSIS — H26493 Other secondary cataract, bilateral: Secondary | ICD-10-CM | POA: Diagnosis not present

## 2018-04-09 DIAGNOSIS — E119 Type 2 diabetes mellitus without complications: Secondary | ICD-10-CM | POA: Diagnosis not present

## 2018-04-09 DIAGNOSIS — H524 Presbyopia: Secondary | ICD-10-CM | POA: Diagnosis not present

## 2018-04-09 DIAGNOSIS — H35033 Hypertensive retinopathy, bilateral: Secondary | ICD-10-CM | POA: Diagnosis not present

## 2018-04-09 DIAGNOSIS — H52223 Regular astigmatism, bilateral: Secondary | ICD-10-CM | POA: Diagnosis not present

## 2018-05-07 DIAGNOSIS — M1990 Unspecified osteoarthritis, unspecified site: Secondary | ICD-10-CM | POA: Diagnosis not present

## 2018-05-07 DIAGNOSIS — E782 Mixed hyperlipidemia: Secondary | ICD-10-CM | POA: Diagnosis not present

## 2018-05-07 DIAGNOSIS — E119 Type 2 diabetes mellitus without complications: Secondary | ICD-10-CM | POA: Diagnosis not present

## 2018-05-07 DIAGNOSIS — N183 Chronic kidney disease, stage 3 (moderate): Secondary | ICD-10-CM | POA: Diagnosis not present

## 2018-05-07 DIAGNOSIS — E1140 Type 2 diabetes mellitus with diabetic neuropathy, unspecified: Secondary | ICD-10-CM | POA: Diagnosis not present

## 2018-05-07 DIAGNOSIS — I10 Essential (primary) hypertension: Secondary | ICD-10-CM | POA: Diagnosis not present

## 2018-05-07 DIAGNOSIS — C61 Malignant neoplasm of prostate: Secondary | ICD-10-CM | POA: Diagnosis not present

## 2018-05-07 DIAGNOSIS — Z8546 Personal history of malignant neoplasm of prostate: Secondary | ICD-10-CM | POA: Diagnosis not present

## 2018-05-07 DIAGNOSIS — F341 Dysthymic disorder: Secondary | ICD-10-CM | POA: Diagnosis not present

## 2018-05-08 DIAGNOSIS — Z125 Encounter for screening for malignant neoplasm of prostate: Secondary | ICD-10-CM | POA: Diagnosis not present

## 2018-05-08 DIAGNOSIS — I10 Essential (primary) hypertension: Secondary | ICD-10-CM | POA: Diagnosis not present

## 2018-05-08 DIAGNOSIS — R05 Cough: Secondary | ICD-10-CM | POA: Diagnosis not present

## 2018-05-08 DIAGNOSIS — N183 Chronic kidney disease, stage 3 (moderate): Secondary | ICD-10-CM | POA: Diagnosis not present

## 2018-05-08 DIAGNOSIS — I679 Cerebrovascular disease, unspecified: Secondary | ICD-10-CM | POA: Diagnosis not present

## 2018-05-08 DIAGNOSIS — F341 Dysthymic disorder: Secondary | ICD-10-CM | POA: Diagnosis not present

## 2018-05-08 DIAGNOSIS — E119 Type 2 diabetes mellitus without complications: Secondary | ICD-10-CM | POA: Diagnosis not present

## 2018-05-08 DIAGNOSIS — Z0001 Encounter for general adult medical examination with abnormal findings: Secondary | ICD-10-CM | POA: Diagnosis not present

## 2018-05-08 DIAGNOSIS — E1140 Type 2 diabetes mellitus with diabetic neuropathy, unspecified: Secondary | ICD-10-CM | POA: Diagnosis not present

## 2018-05-08 DIAGNOSIS — R6889 Other general symptoms and signs: Secondary | ICD-10-CM | POA: Diagnosis not present

## 2018-05-08 DIAGNOSIS — Z23 Encounter for immunization: Secondary | ICD-10-CM | POA: Diagnosis not present

## 2018-05-08 DIAGNOSIS — Z8546 Personal history of malignant neoplasm of prostate: Secondary | ICD-10-CM | POA: Diagnosis not present

## 2018-05-08 DIAGNOSIS — E782 Mixed hyperlipidemia: Secondary | ICD-10-CM | POA: Diagnosis not present

## 2018-11-05 DIAGNOSIS — I679 Cerebrovascular disease, unspecified: Secondary | ICD-10-CM | POA: Diagnosis not present

## 2018-11-05 DIAGNOSIS — I10 Essential (primary) hypertension: Secondary | ICD-10-CM | POA: Diagnosis not present

## 2018-11-05 DIAGNOSIS — Z8546 Personal history of malignant neoplasm of prostate: Secondary | ICD-10-CM | POA: Diagnosis not present

## 2018-11-05 DIAGNOSIS — N183 Chronic kidney disease, stage 3 (moderate): Secondary | ICD-10-CM | POA: Diagnosis not present

## 2018-11-05 DIAGNOSIS — R05 Cough: Secondary | ICD-10-CM | POA: Diagnosis not present

## 2018-11-05 DIAGNOSIS — F341 Dysthymic disorder: Secondary | ICD-10-CM | POA: Diagnosis not present

## 2018-11-05 DIAGNOSIS — E782 Mixed hyperlipidemia: Secondary | ICD-10-CM | POA: Diagnosis not present

## 2018-11-05 DIAGNOSIS — M1990 Unspecified osteoarthritis, unspecified site: Secondary | ICD-10-CM | POA: Diagnosis not present

## 2018-11-05 DIAGNOSIS — E1140 Type 2 diabetes mellitus with diabetic neuropathy, unspecified: Secondary | ICD-10-CM | POA: Diagnosis not present

## 2018-11-15 DIAGNOSIS — N183 Chronic kidney disease, stage 3 (moderate): Secondary | ICD-10-CM | POA: Diagnosis not present

## 2018-11-15 DIAGNOSIS — I10 Essential (primary) hypertension: Secondary | ICD-10-CM | POA: Diagnosis not present

## 2018-11-15 DIAGNOSIS — E782 Mixed hyperlipidemia: Secondary | ICD-10-CM | POA: Diagnosis not present

## 2018-11-15 DIAGNOSIS — E1140 Type 2 diabetes mellitus with diabetic neuropathy, unspecified: Secondary | ICD-10-CM | POA: Diagnosis not present

## 2018-11-15 DIAGNOSIS — E1165 Type 2 diabetes mellitus with hyperglycemia: Secondary | ICD-10-CM | POA: Diagnosis not present

## 2018-11-15 DIAGNOSIS — E119 Type 2 diabetes mellitus without complications: Secondary | ICD-10-CM | POA: Diagnosis not present

## 2019-01-31 DIAGNOSIS — Z23 Encounter for immunization: Secondary | ICD-10-CM | POA: Diagnosis not present

## 2019-02-17 DIAGNOSIS — N183 Chronic kidney disease, stage 3 (moderate): Secondary | ICD-10-CM | POA: Diagnosis not present

## 2019-02-17 DIAGNOSIS — C61 Malignant neoplasm of prostate: Secondary | ICD-10-CM | POA: Diagnosis not present

## 2019-02-17 DIAGNOSIS — E782 Mixed hyperlipidemia: Secondary | ICD-10-CM | POA: Diagnosis not present

## 2019-02-17 DIAGNOSIS — M1990 Unspecified osteoarthritis, unspecified site: Secondary | ICD-10-CM | POA: Diagnosis not present

## 2019-02-17 DIAGNOSIS — F341 Dysthymic disorder: Secondary | ICD-10-CM | POA: Diagnosis not present

## 2019-02-17 DIAGNOSIS — E1140 Type 2 diabetes mellitus with diabetic neuropathy, unspecified: Secondary | ICD-10-CM | POA: Diagnosis not present

## 2019-02-17 DIAGNOSIS — I10 Essential (primary) hypertension: Secondary | ICD-10-CM | POA: Diagnosis not present

## 2019-02-17 DIAGNOSIS — Z8546 Personal history of malignant neoplasm of prostate: Secondary | ICD-10-CM | POA: Diagnosis not present

## 2019-02-17 DIAGNOSIS — E119 Type 2 diabetes mellitus without complications: Secondary | ICD-10-CM | POA: Diagnosis not present

## 2019-03-18 DIAGNOSIS — M1990 Unspecified osteoarthritis, unspecified site: Secondary | ICD-10-CM | POA: Diagnosis not present

## 2019-03-18 DIAGNOSIS — E1140 Type 2 diabetes mellitus with diabetic neuropathy, unspecified: Secondary | ICD-10-CM | POA: Diagnosis not present

## 2019-03-18 DIAGNOSIS — F341 Dysthymic disorder: Secondary | ICD-10-CM | POA: Diagnosis not present

## 2019-03-18 DIAGNOSIS — Z8546 Personal history of malignant neoplasm of prostate: Secondary | ICD-10-CM | POA: Diagnosis not present

## 2019-03-18 DIAGNOSIS — C61 Malignant neoplasm of prostate: Secondary | ICD-10-CM | POA: Diagnosis not present

## 2019-03-18 DIAGNOSIS — N1831 Chronic kidney disease, stage 3a: Secondary | ICD-10-CM | POA: Diagnosis not present

## 2019-03-18 DIAGNOSIS — I10 Essential (primary) hypertension: Secondary | ICD-10-CM | POA: Diagnosis not present

## 2019-03-18 DIAGNOSIS — E119 Type 2 diabetes mellitus without complications: Secondary | ICD-10-CM | POA: Diagnosis not present

## 2019-03-18 DIAGNOSIS — E782 Mixed hyperlipidemia: Secondary | ICD-10-CM | POA: Diagnosis not present

## 2019-04-10 ENCOUNTER — Ambulatory Visit: Payer: Medicare Other | Admitting: *Deleted

## 2019-04-15 DIAGNOSIS — E782 Mixed hyperlipidemia: Secondary | ICD-10-CM | POA: Diagnosis not present

## 2019-04-15 DIAGNOSIS — C61 Malignant neoplasm of prostate: Secondary | ICD-10-CM | POA: Diagnosis not present

## 2019-04-15 DIAGNOSIS — Z8546 Personal history of malignant neoplasm of prostate: Secondary | ICD-10-CM | POA: Diagnosis not present

## 2019-04-15 DIAGNOSIS — M1990 Unspecified osteoarthritis, unspecified site: Secondary | ICD-10-CM | POA: Diagnosis not present

## 2019-04-15 DIAGNOSIS — E1140 Type 2 diabetes mellitus with diabetic neuropathy, unspecified: Secondary | ICD-10-CM | POA: Diagnosis not present

## 2019-04-15 DIAGNOSIS — F341 Dysthymic disorder: Secondary | ICD-10-CM | POA: Diagnosis not present

## 2019-04-15 DIAGNOSIS — E119 Type 2 diabetes mellitus without complications: Secondary | ICD-10-CM | POA: Diagnosis not present

## 2019-04-15 DIAGNOSIS — I10 Essential (primary) hypertension: Secondary | ICD-10-CM | POA: Diagnosis not present

## 2019-04-29 DIAGNOSIS — M1990 Unspecified osteoarthritis, unspecified site: Secondary | ICD-10-CM | POA: Diagnosis not present

## 2019-04-29 DIAGNOSIS — Z8546 Personal history of malignant neoplasm of prostate: Secondary | ICD-10-CM | POA: Diagnosis not present

## 2019-04-29 DIAGNOSIS — F341 Dysthymic disorder: Secondary | ICD-10-CM | POA: Diagnosis not present

## 2019-04-29 DIAGNOSIS — E119 Type 2 diabetes mellitus without complications: Secondary | ICD-10-CM | POA: Diagnosis not present

## 2019-04-29 DIAGNOSIS — E1140 Type 2 diabetes mellitus with diabetic neuropathy, unspecified: Secondary | ICD-10-CM | POA: Diagnosis not present

## 2019-04-29 DIAGNOSIS — I10 Essential (primary) hypertension: Secondary | ICD-10-CM | POA: Diagnosis not present

## 2019-04-29 DIAGNOSIS — E782 Mixed hyperlipidemia: Secondary | ICD-10-CM | POA: Diagnosis not present

## 2019-04-29 DIAGNOSIS — C61 Malignant neoplasm of prostate: Secondary | ICD-10-CM | POA: Diagnosis not present

## 2019-06-06 DIAGNOSIS — E119 Type 2 diabetes mellitus without complications: Secondary | ICD-10-CM | POA: Diagnosis not present

## 2019-06-06 DIAGNOSIS — Z8546 Personal history of malignant neoplasm of prostate: Secondary | ICD-10-CM | POA: Diagnosis not present

## 2019-06-06 DIAGNOSIS — F341 Dysthymic disorder: Secondary | ICD-10-CM | POA: Diagnosis not present

## 2019-06-06 DIAGNOSIS — E782 Mixed hyperlipidemia: Secondary | ICD-10-CM | POA: Diagnosis not present

## 2019-06-06 DIAGNOSIS — C61 Malignant neoplasm of prostate: Secondary | ICD-10-CM | POA: Diagnosis not present

## 2019-06-06 DIAGNOSIS — I10 Essential (primary) hypertension: Secondary | ICD-10-CM | POA: Diagnosis not present

## 2019-06-06 DIAGNOSIS — E1140 Type 2 diabetes mellitus with diabetic neuropathy, unspecified: Secondary | ICD-10-CM | POA: Diagnosis not present

## 2019-06-06 DIAGNOSIS — M1990 Unspecified osteoarthritis, unspecified site: Secondary | ICD-10-CM | POA: Diagnosis not present

## 2019-06-11 DIAGNOSIS — N183 Chronic kidney disease, stage 3 unspecified: Secondary | ICD-10-CM | POA: Diagnosis not present

## 2019-06-11 DIAGNOSIS — I679 Cerebrovascular disease, unspecified: Secondary | ICD-10-CM | POA: Diagnosis not present

## 2019-06-11 DIAGNOSIS — E782 Mixed hyperlipidemia: Secondary | ICD-10-CM | POA: Diagnosis not present

## 2019-06-11 DIAGNOSIS — F341 Dysthymic disorder: Secondary | ICD-10-CM | POA: Diagnosis not present

## 2019-06-11 DIAGNOSIS — Z8546 Personal history of malignant neoplasm of prostate: Secondary | ICD-10-CM | POA: Diagnosis not present

## 2019-06-11 DIAGNOSIS — K635 Polyp of colon: Secondary | ICD-10-CM | POA: Diagnosis not present

## 2019-06-11 DIAGNOSIS — E559 Vitamin D deficiency, unspecified: Secondary | ICD-10-CM | POA: Diagnosis not present

## 2019-06-11 DIAGNOSIS — Z0000 Encounter for general adult medical examination without abnormal findings: Secondary | ICD-10-CM | POA: Diagnosis not present

## 2019-06-11 DIAGNOSIS — I10 Essential (primary) hypertension: Secondary | ICD-10-CM | POA: Diagnosis not present

## 2019-06-11 DIAGNOSIS — Z1389 Encounter for screening for other disorder: Secondary | ICD-10-CM | POA: Diagnosis not present

## 2019-06-11 DIAGNOSIS — Z79899 Other long term (current) drug therapy: Secondary | ICD-10-CM | POA: Diagnosis not present

## 2019-06-11 DIAGNOSIS — E1165 Type 2 diabetes mellitus with hyperglycemia: Secondary | ICD-10-CM | POA: Diagnosis not present

## 2019-06-11 DIAGNOSIS — Z1211 Encounter for screening for malignant neoplasm of colon: Secondary | ICD-10-CM | POA: Diagnosis not present

## 2019-06-11 DIAGNOSIS — E1140 Type 2 diabetes mellitus with diabetic neuropathy, unspecified: Secondary | ICD-10-CM | POA: Diagnosis not present

## 2019-07-02 DIAGNOSIS — E1140 Type 2 diabetes mellitus with diabetic neuropathy, unspecified: Secondary | ICD-10-CM | POA: Diagnosis not present

## 2019-07-02 DIAGNOSIS — M1990 Unspecified osteoarthritis, unspecified site: Secondary | ICD-10-CM | POA: Diagnosis not present

## 2019-07-02 DIAGNOSIS — F341 Dysthymic disorder: Secondary | ICD-10-CM | POA: Diagnosis not present

## 2019-07-02 DIAGNOSIS — I10 Essential (primary) hypertension: Secondary | ICD-10-CM | POA: Diagnosis not present

## 2019-07-02 DIAGNOSIS — E782 Mixed hyperlipidemia: Secondary | ICD-10-CM | POA: Diagnosis not present

## 2019-07-02 DIAGNOSIS — C61 Malignant neoplasm of prostate: Secondary | ICD-10-CM | POA: Diagnosis not present

## 2019-07-02 DIAGNOSIS — Z8546 Personal history of malignant neoplasm of prostate: Secondary | ICD-10-CM | POA: Diagnosis not present

## 2019-07-02 DIAGNOSIS — E119 Type 2 diabetes mellitus without complications: Secondary | ICD-10-CM | POA: Diagnosis not present

## 2019-08-14 DIAGNOSIS — M1990 Unspecified osteoarthritis, unspecified site: Secondary | ICD-10-CM | POA: Diagnosis not present

## 2019-08-14 DIAGNOSIS — Z8546 Personal history of malignant neoplasm of prostate: Secondary | ICD-10-CM | POA: Diagnosis not present

## 2019-08-14 DIAGNOSIS — E119 Type 2 diabetes mellitus without complications: Secondary | ICD-10-CM | POA: Diagnosis not present

## 2019-08-14 DIAGNOSIS — E1140 Type 2 diabetes mellitus with diabetic neuropathy, unspecified: Secondary | ICD-10-CM | POA: Diagnosis not present

## 2019-08-14 DIAGNOSIS — N183 Chronic kidney disease, stage 3 unspecified: Secondary | ICD-10-CM | POA: Diagnosis not present

## 2019-08-14 DIAGNOSIS — F341 Dysthymic disorder: Secondary | ICD-10-CM | POA: Diagnosis not present

## 2019-08-14 DIAGNOSIS — E782 Mixed hyperlipidemia: Secondary | ICD-10-CM | POA: Diagnosis not present

## 2019-08-14 DIAGNOSIS — I10 Essential (primary) hypertension: Secondary | ICD-10-CM | POA: Diagnosis not present

## 2019-08-14 DIAGNOSIS — C61 Malignant neoplasm of prostate: Secondary | ICD-10-CM | POA: Diagnosis not present

## 2019-09-09 DIAGNOSIS — H524 Presbyopia: Secondary | ICD-10-CM | POA: Diagnosis not present

## 2019-09-09 DIAGNOSIS — H35033 Hypertensive retinopathy, bilateral: Secondary | ICD-10-CM | POA: Diagnosis not present

## 2019-09-09 DIAGNOSIS — H35413 Lattice degeneration of retina, bilateral: Secondary | ICD-10-CM | POA: Diagnosis not present

## 2019-09-09 DIAGNOSIS — H52223 Regular astigmatism, bilateral: Secondary | ICD-10-CM | POA: Diagnosis not present

## 2019-09-09 DIAGNOSIS — E119 Type 2 diabetes mellitus without complications: Secondary | ICD-10-CM | POA: Diagnosis not present

## 2019-09-09 DIAGNOSIS — H26493 Other secondary cataract, bilateral: Secondary | ICD-10-CM | POA: Diagnosis not present

## 2019-09-09 DIAGNOSIS — H35371 Puckering of macula, right eye: Secondary | ICD-10-CM | POA: Diagnosis not present

## 2019-09-12 DIAGNOSIS — E119 Type 2 diabetes mellitus without complications: Secondary | ICD-10-CM | POA: Diagnosis not present

## 2019-09-12 DIAGNOSIS — I10 Essential (primary) hypertension: Secondary | ICD-10-CM | POA: Diagnosis not present

## 2019-09-12 DIAGNOSIS — M1990 Unspecified osteoarthritis, unspecified site: Secondary | ICD-10-CM | POA: Diagnosis not present

## 2019-09-12 DIAGNOSIS — E782 Mixed hyperlipidemia: Secondary | ICD-10-CM | POA: Diagnosis not present

## 2019-09-12 DIAGNOSIS — Z8546 Personal history of malignant neoplasm of prostate: Secondary | ICD-10-CM | POA: Diagnosis not present

## 2019-09-12 DIAGNOSIS — E1140 Type 2 diabetes mellitus with diabetic neuropathy, unspecified: Secondary | ICD-10-CM | POA: Diagnosis not present

## 2019-09-12 DIAGNOSIS — N183 Chronic kidney disease, stage 3 unspecified: Secondary | ICD-10-CM | POA: Diagnosis not present

## 2019-09-12 DIAGNOSIS — F341 Dysthymic disorder: Secondary | ICD-10-CM | POA: Diagnosis not present

## 2019-09-12 DIAGNOSIS — C61 Malignant neoplasm of prostate: Secondary | ICD-10-CM | POA: Diagnosis not present

## 2019-09-29 DIAGNOSIS — I10 Essential (primary) hypertension: Secondary | ICD-10-CM | POA: Diagnosis not present

## 2019-09-29 DIAGNOSIS — E1140 Type 2 diabetes mellitus with diabetic neuropathy, unspecified: Secondary | ICD-10-CM | POA: Diagnosis not present

## 2019-09-29 DIAGNOSIS — M1990 Unspecified osteoarthritis, unspecified site: Secondary | ICD-10-CM | POA: Diagnosis not present

## 2019-09-29 DIAGNOSIS — F341 Dysthymic disorder: Secondary | ICD-10-CM | POA: Diagnosis not present

## 2019-09-29 DIAGNOSIS — Z8546 Personal history of malignant neoplasm of prostate: Secondary | ICD-10-CM | POA: Diagnosis not present

## 2019-09-29 DIAGNOSIS — N183 Chronic kidney disease, stage 3 unspecified: Secondary | ICD-10-CM | POA: Diagnosis not present

## 2019-09-29 DIAGNOSIS — C61 Malignant neoplasm of prostate: Secondary | ICD-10-CM | POA: Diagnosis not present

## 2019-09-29 DIAGNOSIS — E119 Type 2 diabetes mellitus without complications: Secondary | ICD-10-CM | POA: Diagnosis not present

## 2019-09-29 DIAGNOSIS — E782 Mixed hyperlipidemia: Secondary | ICD-10-CM | POA: Diagnosis not present

## 2019-12-17 DIAGNOSIS — E1140 Type 2 diabetes mellitus with diabetic neuropathy, unspecified: Secondary | ICD-10-CM | POA: Diagnosis not present

## 2019-12-18 DIAGNOSIS — E1140 Type 2 diabetes mellitus with diabetic neuropathy, unspecified: Secondary | ICD-10-CM | POA: Diagnosis not present

## 2019-12-18 DIAGNOSIS — M1990 Unspecified osteoarthritis, unspecified site: Secondary | ICD-10-CM | POA: Diagnosis not present

## 2019-12-18 DIAGNOSIS — F341 Dysthymic disorder: Secondary | ICD-10-CM | POA: Diagnosis not present

## 2019-12-18 DIAGNOSIS — C61 Malignant neoplasm of prostate: Secondary | ICD-10-CM | POA: Diagnosis not present

## 2019-12-18 DIAGNOSIS — I10 Essential (primary) hypertension: Secondary | ICD-10-CM | POA: Diagnosis not present

## 2019-12-18 DIAGNOSIS — N183 Chronic kidney disease, stage 3 unspecified: Secondary | ICD-10-CM | POA: Diagnosis not present

## 2019-12-18 DIAGNOSIS — E119 Type 2 diabetes mellitus without complications: Secondary | ICD-10-CM | POA: Diagnosis not present

## 2019-12-18 DIAGNOSIS — Z8546 Personal history of malignant neoplasm of prostate: Secondary | ICD-10-CM | POA: Diagnosis not present

## 2019-12-18 DIAGNOSIS — E782 Mixed hyperlipidemia: Secondary | ICD-10-CM | POA: Diagnosis not present

## 2020-01-18 DIAGNOSIS — Z23 Encounter for immunization: Secondary | ICD-10-CM | POA: Diagnosis not present

## 2020-02-25 DIAGNOSIS — M5431 Sciatica, right side: Secondary | ICD-10-CM | POA: Diagnosis not present

## 2020-02-25 DIAGNOSIS — Z23 Encounter for immunization: Secondary | ICD-10-CM | POA: Diagnosis not present

## 2020-02-25 DIAGNOSIS — R413 Other amnesia: Secondary | ICD-10-CM | POA: Diagnosis not present

## 2020-03-01 ENCOUNTER — Ambulatory Visit
Admission: RE | Admit: 2020-03-01 | Discharge: 2020-03-01 | Disposition: A | Discharge status: Home / Self Care | Payer: Medicare Other | Source: Ambulatory Visit | Attending: Geriatric Medicine | Admitting: Geriatric Medicine

## 2020-03-01 ENCOUNTER — Other Ambulatory Visit: Payer: Self-pay | Admitting: Geriatric Medicine

## 2020-03-01 DIAGNOSIS — M5431 Sciatica, right side: Secondary | ICD-10-CM

## 2020-03-01 DIAGNOSIS — Z7984 Long term (current) use of oral hypoglycemic drugs: Secondary | ICD-10-CM | POA: Diagnosis not present

## 2020-03-01 DIAGNOSIS — E1140 Type 2 diabetes mellitus with diabetic neuropathy, unspecified: Secondary | ICD-10-CM | POA: Diagnosis not present

## 2020-03-01 DIAGNOSIS — M1611 Unilateral primary osteoarthritis, right hip: Secondary | ICD-10-CM | POA: Diagnosis not present

## 2020-03-01 DIAGNOSIS — M85851 Other specified disorders of bone density and structure, right thigh: Secondary | ICD-10-CM | POA: Diagnosis not present

## 2020-03-03 DIAGNOSIS — F341 Dysthymic disorder: Secondary | ICD-10-CM | POA: Diagnosis not present

## 2020-03-03 DIAGNOSIS — M1990 Unspecified osteoarthritis, unspecified site: Secondary | ICD-10-CM | POA: Diagnosis not present

## 2020-03-03 DIAGNOSIS — C61 Malignant neoplasm of prostate: Secondary | ICD-10-CM | POA: Diagnosis not present

## 2020-03-03 DIAGNOSIS — Z8546 Personal history of malignant neoplasm of prostate: Secondary | ICD-10-CM | POA: Diagnosis not present

## 2020-03-03 DIAGNOSIS — E782 Mixed hyperlipidemia: Secondary | ICD-10-CM | POA: Diagnosis not present

## 2020-03-03 DIAGNOSIS — E1140 Type 2 diabetes mellitus with diabetic neuropathy, unspecified: Secondary | ICD-10-CM | POA: Diagnosis not present

## 2020-03-03 DIAGNOSIS — N183 Chronic kidney disease, stage 3 unspecified: Secondary | ICD-10-CM | POA: Diagnosis not present

## 2020-03-03 DIAGNOSIS — M47816 Spondylosis without myelopathy or radiculopathy, lumbar region: Secondary | ICD-10-CM | POA: Diagnosis not present

## 2020-03-03 DIAGNOSIS — E119 Type 2 diabetes mellitus without complications: Secondary | ICD-10-CM | POA: Diagnosis not present

## 2020-03-03 DIAGNOSIS — M5416 Radiculopathy, lumbar region: Secondary | ICD-10-CM | POA: Diagnosis not present

## 2020-03-03 DIAGNOSIS — M5441 Lumbago with sciatica, right side: Secondary | ICD-10-CM | POA: Diagnosis not present

## 2020-03-03 DIAGNOSIS — G9519 Other vascular myelopathies: Secondary | ICD-10-CM | POA: Diagnosis not present

## 2020-03-03 DIAGNOSIS — I10 Essential (primary) hypertension: Secondary | ICD-10-CM | POA: Diagnosis not present

## 2020-03-04 ENCOUNTER — Other Ambulatory Visit: Payer: Self-pay | Admitting: Neurosurgery

## 2020-03-04 DIAGNOSIS — M5416 Radiculopathy, lumbar region: Secondary | ICD-10-CM | POA: Diagnosis not present

## 2020-03-04 DIAGNOSIS — M5441 Lumbago with sciatica, right side: Secondary | ICD-10-CM | POA: Diagnosis not present

## 2020-03-04 DIAGNOSIS — G9519 Other vascular myelopathies: Secondary | ICD-10-CM | POA: Diagnosis not present

## 2020-03-04 DIAGNOSIS — M5127 Other intervertebral disc displacement, lumbosacral region: Secondary | ICD-10-CM | POA: Diagnosis not present

## 2020-03-04 DIAGNOSIS — M5136 Other intervertebral disc degeneration, lumbar region: Secondary | ICD-10-CM | POA: Diagnosis not present

## 2020-03-04 DIAGNOSIS — M545 Low back pain, unspecified: Secondary | ICD-10-CM | POA: Diagnosis not present

## 2020-03-04 DIAGNOSIS — M47816 Spondylosis without myelopathy or radiculopathy, lumbar region: Secondary | ICD-10-CM | POA: Diagnosis not present

## 2020-03-05 ENCOUNTER — Other Ambulatory Visit (HOSPITAL_COMMUNITY)
Admission: RE | Admit: 2020-03-05 | Discharge: 2020-03-05 | Disposition: A | Discharge status: Home / Self Care | Payer: Medicare Other | Source: Ambulatory Visit | Attending: Neurosurgery | Admitting: Neurosurgery

## 2020-03-05 ENCOUNTER — Encounter (HOSPITAL_COMMUNITY): Payer: Self-pay | Admitting: Neurosurgery

## 2020-03-05 DIAGNOSIS — Z01812 Encounter for preprocedural laboratory examination: Secondary | ICD-10-CM | POA: Insufficient documentation

## 2020-03-05 DIAGNOSIS — Z20822 Contact with and (suspected) exposure to covid-19: Secondary | ICD-10-CM | POA: Insufficient documentation

## 2020-03-05 NOTE — Progress Notes (Signed)
Spoke with patient's wife [name] for PAT information.  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    . Do not take oral diabetes medicines (Metformin, Glipizide) the morning of surgery.  . If your blood sugar is less than 70 mg/dL, you will need to treat for low blood sugar: o Treat a low blood sugar (less than 70 mg/dL) with  cup of clear juice (cranberry or apple), 4 glucose tablets, OR glucose gel. o Recheck blood sugar in 15 minutes after treatment (to make sure it is greater than 70 mg/dL). If your blood sugar is not greater than 70 mg/dL on recheck, call [phone] for further instructions.  Anesthesia review: Yes  STOP now taking any Aspirin (unless otherwise instructed by your surgeon), Aleve, Naproxen, Ibuprofen, Motrin, Advil, Goody's, BC's, all herbal medications, fish oil, and all vitamins.   Coronavirus Screening Covid test scheduled on 03/05/20 Do you have any of the following symptoms:  Cough yes/no: No Fever (>100.65F)  yes/no: No Runny nose yes/no: No Sore throat yes/no: No Difficulty breathing/shortness of breath  yes/no: No  Have you traveled in the last 14 days and where? yes/no: No  Wife [name] verbalized understanding of instructions that were given via phone.

## 2020-03-06 LAB — SARS CORONAVIRUS 2 (TAT 6-24 HRS): SARS Coronavirus 2: NEGATIVE

## 2020-03-08 ENCOUNTER — Observation Stay (HOSPITAL_COMMUNITY)
Admission: RE | Admit: 2020-03-08 | Discharge: 2020-03-08 | Disposition: A | Discharge status: Home / Self Care | Payer: Medicare Other | Attending: Neurosurgery | Admitting: Neurosurgery

## 2020-03-08 ENCOUNTER — Encounter (HOSPITAL_COMMUNITY): Payer: Self-pay | Admitting: Neurosurgery

## 2020-03-08 ENCOUNTER — Ambulatory Visit (HOSPITAL_COMMUNITY): Payer: Medicare Other | Admitting: Physician Assistant

## 2020-03-08 ENCOUNTER — Ambulatory Visit (HOSPITAL_COMMUNITY): Payer: Medicare Other

## 2020-03-08 ENCOUNTER — Other Ambulatory Visit: Payer: Self-pay

## 2020-03-08 ENCOUNTER — Encounter (HOSPITAL_COMMUNITY)
Admission: RE | Disposition: A | Discharge status: Home / Self Care | Payer: Self-pay | Source: Home / Self Care | Attending: Neurosurgery

## 2020-03-08 DIAGNOSIS — I129 Hypertensive chronic kidney disease with stage 1 through stage 4 chronic kidney disease, or unspecified chronic kidney disease: Secondary | ICD-10-CM | POA: Diagnosis not present

## 2020-03-08 DIAGNOSIS — M5127 Other intervertebral disc displacement, lumbosacral region: Secondary | ICD-10-CM | POA: Insufficient documentation

## 2020-03-08 DIAGNOSIS — E1122 Type 2 diabetes mellitus with diabetic chronic kidney disease: Secondary | ICD-10-CM | POA: Diagnosis not present

## 2020-03-08 DIAGNOSIS — M5136 Other intervertebral disc degeneration, lumbar region: Secondary | ICD-10-CM | POA: Diagnosis not present

## 2020-03-08 DIAGNOSIS — M5126 Other intervertebral disc displacement, lumbar region: Secondary | ICD-10-CM | POA: Diagnosis present

## 2020-03-08 DIAGNOSIS — Z981 Arthrodesis status: Secondary | ICD-10-CM | POA: Diagnosis not present

## 2020-03-08 DIAGNOSIS — N189 Chronic kidney disease, unspecified: Secondary | ICD-10-CM | POA: Diagnosis not present

## 2020-03-08 DIAGNOSIS — M48062 Spinal stenosis, lumbar region with neurogenic claudication: Secondary | ICD-10-CM | POA: Diagnosis not present

## 2020-03-08 DIAGNOSIS — M4727 Other spondylosis with radiculopathy, lumbosacral region: Secondary | ICD-10-CM | POA: Diagnosis not present

## 2020-03-08 DIAGNOSIS — M5117 Intervertebral disc disorders with radiculopathy, lumbosacral region: Secondary | ICD-10-CM | POA: Diagnosis not present

## 2020-03-08 DIAGNOSIS — M5441 Lumbago with sciatica, right side: Secondary | ICD-10-CM | POA: Diagnosis present

## 2020-03-08 DIAGNOSIS — M5416 Radiculopathy, lumbar region: Secondary | ICD-10-CM | POA: Diagnosis not present

## 2020-03-08 DIAGNOSIS — Z419 Encounter for procedure for purposes other than remedying health state, unspecified: Secondary | ICD-10-CM

## 2020-03-08 HISTORY — PX: LUMBAR LAMINECTOMY/ DECOMPRESSION WITH MET-RX: SHX5959

## 2020-03-08 HISTORY — DX: Other intervertebral disc displacement, lumbar region: M51.26

## 2020-03-08 HISTORY — DX: Chronic kidney disease, unspecified: N18.9

## 2020-03-08 LAB — GLUCOSE, CAPILLARY
Glucose-Capillary: 130 mg/dL — ABNORMAL HIGH (ref 70–99)
Glucose-Capillary: 141 mg/dL — ABNORMAL HIGH (ref 70–99)
Glucose-Capillary: 128 mg/dL — ABNORMAL HIGH (ref 70–99)

## 2020-03-08 LAB — BASIC METABOLIC PANEL
Anion gap: 10 (ref 5–15)
BUN: 25 mg/dL — ABNORMAL HIGH (ref 8–23)
CO2: 24 mmol/L (ref 22–32)
Calcium: 9.3 mg/dL (ref 8.9–10.3)
Chloride: 103 mmol/L (ref 98–111)
Creatinine, Ser: 1.32 mg/dL — ABNORMAL HIGH (ref 0.61–1.24)
GFR, Estimated: 54 mL/min — ABNORMAL LOW (ref >60)
Glucose, Bld: 128 mg/dL — ABNORMAL HIGH (ref 70–99)
Potassium: 4.2 mmol/L (ref 3.5–5.1)
Sodium: 137 mmol/L (ref 135–145)

## 2020-03-08 LAB — CBC
HCT: 52.6 % — ABNORMAL HIGH (ref 39.0–52.0)
Hemoglobin: 17.3 g/dL — ABNORMAL HIGH (ref 13.0–17.0)
MCH: 31 pg (ref 26.0–34.0)
MCHC: 32.9 g/dL (ref 30.0–36.0)
MCV: 94.3 fL (ref 80.0–100.0)
Platelets: 319 10*3/uL (ref 150–400)
RBC: 5.58 MIL/uL (ref 4.22–5.81)
RDW: 12.6 % (ref 11.5–15.5)
WBC: 10.1 10*3/uL (ref 4.0–10.5)
nRBC: 0 % (ref 0.0–0.2)

## 2020-03-08 LAB — HEMOGLOBIN A1C
Hgb A1c MFr Bld: 6.1 % — ABNORMAL HIGH (ref 4.8–5.6)
Mean Plasma Glucose: 128 mg/dL

## 2020-03-08 LAB — SURGICAL PCR SCREEN
MRSA, PCR: NEGATIVE
Staphylococcus aureus: NEGATIVE

## 2020-03-08 SURGERY — LUMBAR LAMINECTOMY/ DECOMPRESSION WITH MET-RX
Anesthesia: General | Site: Back | Laterality: Right

## 2020-03-08 MED ORDER — LIDOCAINE-EPINEPHRINE 1 %-1:100000 IJ SOLN
INTRAMUSCULAR | Status: AC
  Filled 2020-03-08: qty 1

## 2020-03-08 MED ORDER — OXYCODONE HCL 5 MG/5ML PO SOLN: 5.0000 mg | Freq: Once | ORAL | Status: DC | PRN

## 2020-03-08 MED ORDER — SERTRALINE HCL 100 MG PO TABS
100.0000 mg | ORAL_TABLET | Freq: Every evening | ORAL | Status: DC
  Filled 2020-03-08: qty 1

## 2020-03-08 MED ORDER — METHYLPREDNISOLONE ACETATE 80 MG/ML IJ SUSP
INTRAMUSCULAR | Status: AC
  Filled 2020-03-08: qty 1

## 2020-03-08 MED ORDER — ONDANSETRON HCL 4 MG PO TABS: 4.0000 mg | ORAL_TABLET | Freq: Four times a day (QID) | ORAL | Status: DC | PRN

## 2020-03-08 MED ORDER — VITAMIN D 25 MCG (1000 UNIT) PO TABS: 2000.0000 [IU] | ORAL_TABLET | Freq: Every evening | ORAL | Status: DC

## 2020-03-08 MED ORDER — METHOCARBAMOL 500 MG PO TABS: 500.0000 mg | ORAL_TABLET | Freq: Four times a day (QID) | ORAL | Status: DC | PRN

## 2020-03-08 MED ORDER — GLIPIZIDE ER 2.5 MG PO TB24: 2.5000 mg | ORAL_TABLET | Freq: Every day | ORAL | Status: DC

## 2020-03-08 MED ORDER — SODIUM CHLORIDE 0.9% FLUSH: 3.0000 mL | INTRAVENOUS | Status: DC | PRN

## 2020-03-08 MED ORDER — ONDANSETRON HCL 4 MG/2ML IJ SOLN
INTRAMUSCULAR | Status: DC | PRN
  Administered 2020-03-08: 4 mg via INTRAVENOUS

## 2020-03-08 MED ORDER — FLEET ENEMA 7-19 GM/118ML RE ENEM: 1.0000 | ENEMA | Freq: Once | RECTAL | Status: DC | PRN

## 2020-03-08 MED ORDER — PHENYLEPHRINE HCL (PRESSORS) 10 MG/ML IV SOLN
INTRAVENOUS | Status: DC | PRN
  Administered 2020-03-08: 40 ug via INTRAVENOUS
  Administered 2020-03-08: 80 ug via INTRAVENOUS
  Administered 2020-03-08: 120 ug via INTRAVENOUS

## 2020-03-08 MED ORDER — METFORMIN HCL ER 750 MG PO TB24
750.0000 mg | ORAL_TABLET | Freq: Two times a day (BID) | ORAL | Status: DC
  Filled 2020-03-08 (×2): qty 1

## 2020-03-08 MED ORDER — ONDANSETRON HCL 4 MG/2ML IJ SOLN: 4.0000 mg | Freq: Four times a day (QID) | INTRAMUSCULAR | Status: DC | PRN

## 2020-03-08 MED ORDER — KCL IN DEXTROSE-NACL 20-5-0.45 MEQ/L-%-% IV SOLN: INTRAVENOUS | Status: DC

## 2020-03-08 MED ORDER — FENOFIBRATE 54 MG PO TABS
54.0000 mg | ORAL_TABLET | Freq: Every day | ORAL | Status: DC
  Filled 2020-03-08: qty 1

## 2020-03-08 MED ORDER — ALUM & MAG HYDROXIDE-SIMETH 200-200-20 MG/5ML PO SUSP: 30.0000 mL | Freq: Four times a day (QID) | ORAL | Status: DC | PRN

## 2020-03-08 MED ORDER — CHLORHEXIDINE GLUCONATE CLOTH 2 % EX PADS: 6.0000 | MEDICATED_PAD | Freq: Once | CUTANEOUS | Status: DC

## 2020-03-08 MED ORDER — INSULIN ASPART 100 UNIT/ML ~~LOC~~ SOLN: 0.0000 [IU] | Freq: Three times a day (TID) | SUBCUTANEOUS | Status: DC

## 2020-03-08 MED ORDER — OXYCODONE HCL 5 MG PO TABS: 5.0000 mg | ORAL_TABLET | ORAL | Status: DC | PRN

## 2020-03-08 MED ORDER — ONDANSETRON HCL 4 MG/2ML IJ SOLN: 4.0000 mg | Freq: Once | INTRAMUSCULAR | Status: DC | PRN

## 2020-03-08 MED ORDER — METOPROLOL TARTRATE 25 MG PO TABS: 25.0000 mg | ORAL_TABLET | Freq: Two times a day (BID) | ORAL | Status: DC

## 2020-03-08 MED ORDER — PHENOL 1.4 % MT LIQD: 1.0000 | OROMUCOSAL | Status: DC | PRN

## 2020-03-08 MED ORDER — PHENYLEPHRINE HCL-NACL 10-0.9 MG/250ML-% IV SOLN
INTRAVENOUS | Status: DC | PRN
  Administered 2020-03-08: 20 ug/min via INTRAVENOUS

## 2020-03-08 MED ORDER — THROMBIN 5000 UNITS EX SOLR
CUTANEOUS | Status: AC
  Filled 2020-03-08: qty 5000

## 2020-03-08 MED ORDER — IRBESARTAN 150 MG PO TABS: 150.0000 mg | ORAL_TABLET | Freq: Every day | ORAL | Status: DC

## 2020-03-08 MED ORDER — MUPIROCIN 2 % EX OINT
1.0000 "application " | TOPICAL_OINTMENT | Freq: Once | CUTANEOUS | Status: DC
  Filled 2020-03-08: qty 22

## 2020-03-08 MED ORDER — ZOLPIDEM TARTRATE 5 MG PO TABS: 5.0000 mg | ORAL_TABLET | Freq: Every evening | ORAL | Status: DC | PRN

## 2020-03-08 MED ORDER — HYDROCODONE-ACETAMINOPHEN 5-325 MG PO TABS: 1.0000 | ORAL_TABLET | Freq: Four times a day (QID) | ORAL | Status: DC | PRN

## 2020-03-08 MED ORDER — DOCUSATE SODIUM 100 MG PO CAPS: 100.0000 mg | ORAL_CAPSULE | Freq: Two times a day (BID) | ORAL | Status: DC

## 2020-03-08 MED ORDER — PANTOPRAZOLE SODIUM 40 MG IV SOLR: 40.0000 mg | Freq: Every day | INTRAVENOUS | Status: DC

## 2020-03-08 MED ORDER — FENTANYL CITRATE (PF) 100 MCG/2ML IJ SOLN
INTRAMUSCULAR | Status: AC
  Filled 2020-03-08: qty 2

## 2020-03-08 MED ORDER — POLYETHYLENE GLYCOL 3350 17 G PO PACK: 17.0000 g | PACK | Freq: Every day | ORAL | Status: DC | PRN

## 2020-03-08 MED ORDER — ORAL CARE MOUTH RINSE: 15.0000 mL | Freq: Once | OROMUCOSAL | Status: DC

## 2020-03-08 MED ORDER — FENTANYL CITRATE (PF) 100 MCG/2ML IJ SOLN
INTRAMUSCULAR | Status: DC | PRN
  Administered 2020-03-08: 100 ug via INTRAVENOUS

## 2020-03-08 MED ORDER — FENTANYL CITRATE (PF) 250 MCG/5ML IJ SOLN
INTRAMUSCULAR | Status: DC | PRN
  Administered 2020-03-08: 100 ug via INTRAVENOUS

## 2020-03-08 MED ORDER — SODIUM CHLORIDE 0.9% FLUSH: 3.0000 mL | Freq: Two times a day (BID) | INTRAVENOUS | Status: DC

## 2020-03-08 MED ORDER — PROPOFOL 10 MG/ML IV BOLUS
INTRAVENOUS | Status: AC
  Filled 2020-03-08: qty 40

## 2020-03-08 MED ORDER — METHOCARBAMOL 1000 MG/10ML IJ SOLN
500.0000 mg | Freq: Four times a day (QID) | INTRAVENOUS | Status: DC | PRN
  Filled 2020-03-08: qty 5

## 2020-03-08 MED ORDER — CHLORHEXIDINE GLUCONATE 0.12 % MT SOLN
15.0000 mL | Freq: Once | OROMUCOSAL | Status: DC
  Filled 2020-03-08: qty 15

## 2020-03-08 MED ORDER — EPHEDRINE SULFATE 50 MG/ML IJ SOLN
INTRAMUSCULAR | Status: DC | PRN
  Administered 2020-03-08: 5 mg via INTRAVENOUS

## 2020-03-08 MED ORDER — LACTATED RINGERS IV SOLN: INTRAVENOUS | Status: DC

## 2020-03-08 MED ORDER — LIDOCAINE 2% (20 MG/ML) 5 ML SYRINGE
INTRAMUSCULAR | Status: DC | PRN
  Administered 2020-03-08: 60 mg via INTRAVENOUS

## 2020-03-08 MED ORDER — BUPIVACAINE HCL (PF) 0.5 % IJ SOLN
INTRAMUSCULAR | Status: AC
  Filled 2020-03-08: qty 30

## 2020-03-08 MED ORDER — LACTATED RINGERS IV SOLN: INTRAVENOUS | Status: DC | PRN

## 2020-03-08 MED ORDER — METHYLPREDNISOLONE ACETATE 80 MG/ML IJ SUSP
INTRAMUSCULAR | Status: DC | PRN
  Administered 2020-03-08: 80 mg

## 2020-03-08 MED ORDER — ROCURONIUM BROMIDE 10 MG/ML (PF) SYRINGE
PREFILLED_SYRINGE | INTRAVENOUS | Status: DC | PRN
  Administered 2020-03-08: 10 mg via INTRAVENOUS
  Administered 2020-03-08: 50 mg via INTRAVENOUS

## 2020-03-08 MED ORDER — ACETAMINOPHEN 650 MG RE SUPP: 650.0000 mg | RECTAL | Status: DC | PRN

## 2020-03-08 MED ORDER — BISACODYL 10 MG RE SUPP: 10.0000 mg | Freq: Every day | RECTAL | Status: DC | PRN

## 2020-03-08 MED ORDER — ACETAMINOPHEN 325 MG PO TABS: 650.0000 mg | ORAL_TABLET | Freq: Four times a day (QID) | ORAL | Status: DC | PRN

## 2020-03-08 MED ORDER — CEFAZOLIN SODIUM-DEXTROSE 2-4 GM/100ML-% IV SOLN: 2.0000 g | Freq: Three times a day (TID) | INTRAVENOUS | Status: DC

## 2020-03-08 MED ORDER — HYDROCODONE-ACETAMINOPHEN 5-325 MG PO TABS: 1.0000 | ORAL_TABLET | ORAL | Status: DC | PRN

## 2020-03-08 MED ORDER — ATORVASTATIN CALCIUM 10 MG PO TABS: 20.0000 mg | ORAL_TABLET | Freq: Every evening | ORAL | Status: DC

## 2020-03-08 MED ORDER — ACETAMINOPHEN 160 MG/5ML PO SOLN: 325.0000 mg | ORAL | Status: DC | PRN

## 2020-03-08 MED ORDER — THROMBIN 5000 UNITS EX SOLR
OROMUCOSAL | Status: DC | PRN
  Administered 2020-03-08: 5 mL via TOPICAL

## 2020-03-08 MED ORDER — ASPIRIN EC 81 MG PO TBEC: 162.0000 mg | DELAYED_RELEASE_TABLET | Freq: Every day | ORAL | Status: DC

## 2020-03-08 MED ORDER — INSULIN ASPART 100 UNIT/ML ~~LOC~~ SOLN: 0.0000 [IU] | Freq: Every day | SUBCUTANEOUS | Status: DC

## 2020-03-08 MED ORDER — CEFAZOLIN SODIUM-DEXTROSE 2-4 GM/100ML-% IV SOLN
2.0000 g | INTRAVENOUS | Status: DC
  Filled 2020-03-08: qty 100

## 2020-03-08 MED ORDER — MENTHOL 3 MG MT LOZG: 1.0000 | LOZENGE | OROMUCOSAL | Status: DC | PRN

## 2020-03-08 MED ORDER — BUPIVACAINE HCL (PF) 0.5 % IJ SOLN
INTRAMUSCULAR | Status: DC | PRN
  Administered 2020-03-08: 5 mL

## 2020-03-08 MED ORDER — SODIUM CHLORIDE 0.9 % IV SOLN: 250.0000 mL | INTRAVENOUS | Status: DC

## 2020-03-08 MED ORDER — PROPOFOL 10 MG/ML IV BOLUS
INTRAVENOUS | Status: DC | PRN
  Administered 2020-03-08: 30 mg via INTRAVENOUS
  Administered 2020-03-08: 150 mg via INTRAVENOUS

## 2020-03-08 MED ORDER — FENTANYL CITRATE (PF) 250 MCG/5ML IJ SOLN
INTRAMUSCULAR | Status: AC
  Filled 2020-03-08: qty 5

## 2020-03-08 MED ORDER — HYDROMORPHONE HCL 1 MG/ML IJ SOLN: 0.2500 mg | INTRAMUSCULAR | Status: DC | PRN

## 2020-03-08 MED ORDER — ACETAMINOPHEN 325 MG PO TABS: 325.0000 mg | ORAL_TABLET | ORAL | Status: DC | PRN

## 2020-03-08 MED ORDER — DEXAMETHASONE SODIUM PHOSPHATE 10 MG/ML IJ SOLN
INTRAMUSCULAR | Status: DC | PRN
  Administered 2020-03-08: 5 mg via INTRAVENOUS

## 2020-03-08 MED ORDER — LIDOCAINE-EPINEPHRINE 1 %-1:100000 IJ SOLN
INTRAMUSCULAR | Status: DC | PRN
  Administered 2020-03-08: 5 mL

## 2020-03-08 MED ORDER — OXYCODONE HCL 5 MG PO TABS: 5.0000 mg | ORAL_TABLET | Freq: Once | ORAL | Status: DC | PRN

## 2020-03-08 MED ORDER — HYDROMORPHONE HCL 1 MG/ML IJ SOLN: 0.5000 mg | INTRAMUSCULAR | Status: DC | PRN

## 2020-03-08 MED ORDER — ACETAMINOPHEN 325 MG PO TABS: 650.0000 mg | ORAL_TABLET | ORAL | Status: DC | PRN

## 2020-03-08 SURGICAL SUPPLY — 56 items
BAND RUBBER #18 3X1/16 STRL (MISCELLANEOUS) ×6 IMPLANT
BIT DRILL NEURO 2X3.1 SFT TUCH (MISCELLANEOUS) ×1 IMPLANT
BLADE CLIPPER SURG (BLADE) ×3 IMPLANT
BLADE SURG 15 STRL LF DISP TIS (BLADE) ×1 IMPLANT
BLADE SURG 15 STRL SS (BLADE) ×3
CANISTER SUCT 3000ML PPV (MISCELLANEOUS) ×3 IMPLANT
CARTRIDGE OIL MAESTRO DRILL (MISCELLANEOUS) ×1 IMPLANT
COVER WAND RF STERILE (DRAPES) ×3 IMPLANT
DECANTER SPIKE VIAL GLASS SM (MISCELLANEOUS) ×6 IMPLANT
DERMABOND ADVANCED (GAUZE/BANDAGES/DRESSINGS) ×2
DERMABOND ADVANCED .7 DNX12 (GAUZE/BANDAGES/DRESSINGS) ×1 IMPLANT
DIFFUSER DRILL AIR PNEUMATIC (MISCELLANEOUS) ×3 IMPLANT
DRAPE C-ARM 42X72 X-RAY (DRAPES) ×6 IMPLANT
DRAPE LAPAROTOMY 100X72 PEDS (DRAPES) ×3 IMPLANT
DRAPE MICROSCOPE LEICA (MISCELLANEOUS) ×3 IMPLANT
DRILL NEURO 2X3.1 SOFT TOUCH (MISCELLANEOUS) ×3
DRSG OPSITE POSTOP 4X12 (GAUZE/BANDAGES/DRESSINGS) ×3 IMPLANT
DURAPREP 26ML APPLICATOR (WOUND CARE) ×3 IMPLANT
ELECT BLADE 6.5 EXT (BLADE) ×3 IMPLANT
ELECT REM PT RETURN 9FT ADLT (ELECTROSURGICAL) ×3
ELECTRODE REM PT RTRN 9FT ADLT (ELECTROSURGICAL) ×1 IMPLANT
GAUZE 4X4 16PLY RFD (DISPOSABLE) IMPLANT
GLOVE BIO SURGEON STRL SZ8 (GLOVE) ×3 IMPLANT
GLOVE BIOGEL PI IND STRL 7.0 (GLOVE) ×3 IMPLANT
GLOVE BIOGEL PI IND STRL 8 (GLOVE) ×1 IMPLANT
GLOVE BIOGEL PI IND STRL 8.5 (GLOVE) ×1 IMPLANT
GLOVE BIOGEL PI INDICATOR 7.0 (GLOVE) ×6
GLOVE BIOGEL PI INDICATOR 8 (GLOVE) ×2
GLOVE BIOGEL PI INDICATOR 8.5 (GLOVE) ×2
GLOVE ECLIPSE 8.0 STRL XLNG CF (GLOVE) ×3 IMPLANT
GLOVE EXAM NITRILE XL STR (GLOVE) IMPLANT
GLOVE SURG SS PI 7.5 STRL IVOR (GLOVE) ×6 IMPLANT
GOWN STRL REUS W/ TWL LRG LVL3 (GOWN DISPOSABLE) IMPLANT
GOWN STRL REUS W/ TWL XL LVL3 (GOWN DISPOSABLE) ×3 IMPLANT
GOWN STRL REUS W/TWL 2XL LVL3 (GOWN DISPOSABLE) ×3 IMPLANT
GOWN STRL REUS W/TWL LRG LVL3 (GOWN DISPOSABLE)
GOWN STRL REUS W/TWL XL LVL3 (GOWN DISPOSABLE) ×9
HEMOSTAT POWDER SURGIFOAM 1G (HEMOSTASIS) ×3 IMPLANT
KIT BASIN OR (CUSTOM PROCEDURE TRAY) ×3 IMPLANT
KIT TURNOVER KIT B (KITS) ×3 IMPLANT
NEEDLE HYPO 18GX1.5 BLUNT FILL (NEEDLE) ×3 IMPLANT
NEEDLE HYPO 25X1 1.5 SAFETY (NEEDLE) ×3 IMPLANT
NEEDLE SPNL 20GX3.5 QUINCKE YW (NEEDLE) ×3 IMPLANT
NS IRRIG 1000ML POUR BTL (IV SOLUTION) ×3 IMPLANT
OIL CARTRIDGE MAESTRO DRILL (MISCELLANEOUS) ×3
PACK LAMINECTOMY NEURO (CUSTOM PROCEDURE TRAY) ×3 IMPLANT
PAD ARMBOARD 7.5X6 YLW CONV (MISCELLANEOUS) IMPLANT
SPONGE SURGIFOAM ABS GEL SZ50 (HEMOSTASIS) IMPLANT
SUT VIC AB 0 CT1 18XCR BRD8 (SUTURE) ×1 IMPLANT
SUT VIC AB 0 CT1 8-18 (SUTURE) ×3
SUT VIC AB 2-0 CT1 18 (SUTURE) ×3 IMPLANT
SUT VIC AB 3-0 SH 8-18 (SUTURE) ×3 IMPLANT
SYR 5ML LL (SYRINGE) ×3 IMPLANT
TOWEL GREEN STERILE (TOWEL DISPOSABLE) ×3 IMPLANT
TOWEL GREEN STERILE FF (TOWEL DISPOSABLE) ×3 IMPLANT
WATER STERILE IRR 1000ML POUR (IV SOLUTION) ×3 IMPLANT

## 2020-03-08 NOTE — Anesthesia Procedure Notes (Signed)
Procedure Name: Intubation Date/Time: 03/08/2020 7:37 AM Performed by: Glynda Jaeger, CRNA Pre-anesthesia Checklist: Patient identified, Patient being monitored, Timeout performed, Emergency Drugs available and Suction available Patient Re-evaluated:Patient Re-evaluated prior to induction Oxygen Delivery Method: Circle System Utilized Preoxygenation: Pre-oxygenation with 100% oxygen Induction Type: IV induction Ventilation: Mask ventilation without difficulty Laryngoscope Size: Mac Grade View: Grade I Tube type: Oral Tube size: 7.5 mm Number of attempts: 1 Airway Equipment and Method: Stylet Placement Confirmation: ETT inserted through vocal cords under direct vision,  positive ETCO2 and breath sounds checked- equal and bilateral Secured at: 22 cm Tube secured with: Tape Dental Injury: Teeth and Oropharynx as per pre-operative assessment

## 2020-03-08 NOTE — Brief Op Note (Signed)
03/08/2020  9:13 AM  PATIENT:  Richard Torres  74 y.o. male  PRE-OPERATIVE DIAGNOSIS:  Herniated nucleus pulposus of lumbosacral region (far lateral HNP L 5 S 1 right), spondylosis, DDD, lumbago, radiculopathy   POST-OPERATIVE DIAGNOSIS:  Herniated nucleus pulposus of lumbosacral region (far lateral HNP L 5 S 1 right), spondylosis, DDD, lumbago, radiculopathy   PROCEDURE:  Procedure(s): Right Lumbar Five Sacral OneExtraforaminal microdiscectomy (Right) Metx far lateral microdiscectomy  SURGEON:  Surgeon(s) and Role:    Erline Levine, MD - Primary  PHYSICIAN ASSISTANT: Glenford Peers, NP  ASSISTANTS: Poteat, RN   ANESTHESIA:   general  EBL:  25 mL   BLOOD ADMINISTERED:none  DRAINS: none   LOCAL MEDICATIONS USED:  MARCAINE    and LIDOCAINE   SPECIMEN:  No Specimen  DISPOSITION OF SPECIMEN:  N/A  COUNTS:  YES  TOURNIQUET:  * No tourniquets in log *  DICTATION: DICTATION: Patient has right L 5 radiculopathy with  extra-foraminal disc herniation L 5 S 1 right, who has not improved with conservative management.  He is unable to stand and has been in a wheelchair due to severe radicular pain.  PROCEDURE: Patient was brought to the operating room and following smooth and uncomplicated induction of general endotracheal anesthesia, patient was placed in prone position on Wilson frame.  Back was prepped and draped in the usual fashion with Duraprep.  Using C -arm fluoroscopy, the right L 5 S 1 level was localized.  Skin and subcutaneous tissues were infiltrated with lidocaine.  An incision was made and using sequential dilators and the expandable retractor, the right L 5 S 1 pars and interspace were exposed.  Using microscope, the right L 5 pars was thinned with the high speed drill and then the lateral aspect was removed with Kerrison rongeurs, along with the lateral facet joint complex and superior aspect of the sacral ala.  The intertransverse ligament was removed and the lateral  aspect of the L 5 nerve was identified.  The heaped up annulus of the disc space was identified and incised and a thorough discectomy was performed with removal of multiple subligamentous compressive fragments of disc material.  Laterally herniated disc material was mobilized and removed.  The disc was disrupted over a broad front and disc material was removed from the L 5 nerve root, allowing the nerve to follow a less compressed, more natural trajectory.  The interspace was then irrigated.  The course of the nerve root was then palpated and there did not appear to be any additional compression.  The interspace was bathed in Depo-Medrol and fentanyl. The retractor was removed after hemostasis was assured and the fascia was closed with ) vicryl sutures, the subcutaneous tissues with 2-0 vicryl sutures and the skin edges with 3-0 vicryl sutures.  The wound was dressed with Dermabond and an occlusive dressing.  Patient was extubated and taken to Recovery in stable and satisfactory condition.  Counts were correct at the end of the case.   PLAN OF CARE: Admit for overnight observation  PATIENT DISPOSITION:  PACU - hemodynamically stable.   Delay start of Pharmacological VTE agent (>24hrs) due to surgical blood loss or risk of bleeding: yes

## 2020-03-08 NOTE — Progress Notes (Signed)
Subjective: Patient reports that he is doing well after his surgery this morning and that he is ready to be discharged home.   Objective: Vital signs in last 24 hours: Temp:  [97.5 F (36.4 C)-97.7 F (36.5 C)] 97.6 F (36.4 C) (10/18 1131) Pulse Rate:  [59-64] 63 (10/18 1131) Resp:  [13-20] 18 (10/18 1131) BP: (117-133)/(66-83) 130/83 (10/18 1131) SpO2:  [95 %-100 %] 100 % (10/18 1131) Weight:  [83.9 kg] 83.9 kg (10/18 0606)  Intake/Output from previous day: No intake/output data recorded. Intake/Output this shift: Total I/O In: 840 [P.O.:240; I.V.:600] Out: 25 [Blood:25]  Physical Exam: He is A/O X4, conversant, and in good spirits. Reports only mild incisional discomfort. MAEW with good strength that is symmetric bilaterally. Dressing is CDI. Incision is well approximated with no erythema, edema, or drainage.   Lab Results: Recent Labs    03/08/20 0553  WBC 10.1  HGB 17.3*  HCT 52.6*  PLT 319   BMET Recent Labs    03/08/20 0553  NA 137  K 4.2  CL 103  CO2 24  GLUCOSE 128*  BUN 25*  CREATININE 1.32*  CALCIUM 9.3    Studies/Results: DG Lumbar Spine 2-3 Views  Result Date: 03/08/2020 CLINICAL DATA:  Right L5-S1 microdiscectomy. EXAM: DG C-ARM 1-60 MIN; LUMBAR SPINE - 2-3 VIEW FLUOROSCOPY TIME:  14 seconds. C-arm fluoroscopic images were obtained intraoperatively and submitted for post operative interpretation. COMPARISON:  None. FINDINGS: Single frontal intraoperative fluoroscopic images demonstrates surgical instrumentation overlying the right L5-S1 facet joint. Advanced disc height loss at L3-L4 and L4-L5. IMPRESSION: 1. Intraoperative fluoroscopic guidance for right L5-S1 microdiscectomy. Electronically Signed   By: Titus Dubin M.D.   On: 03/08/2020 09:50   DG C-Arm 1-60 Min  Result Date: 03/08/2020 CLINICAL DATA:  Right L5-S1 microdiscectomy. EXAM: DG C-ARM 1-60 MIN; LUMBAR SPINE - 2-3 VIEW FLUOROSCOPY TIME:  14 seconds. C-arm fluoroscopic images were  obtained intraoperatively and submitted for post operative interpretation. COMPARISON:  None. FINDINGS: Single frontal intraoperative fluoroscopic images demonstrates surgical instrumentation overlying the right L5-S1 facet joint. Advanced disc height loss at L3-L4 and L4-L5. IMPRESSION: 1. Intraoperative fluoroscopic guidance for right L5-S1 microdiscectomy. Electronically Signed   By: Titus Dubin M.D.   On: 03/08/2020 09:50    Assessment/Plan: Patient is s/p right L5-S1 extraforaminal far lateral microdiskectomy.  The patient is recovering well from his surgery and is experiencing expected incisional discomfort.  He is stable, in no apparent distress and ambulating well. He feels that he is ready for discharge.  Patient will follow-up in the in 3 weeks.    LOS: 0 days     Marvis Moeller, DNP, NP-C 03/08/2020, 2:04 PM

## 2020-03-08 NOTE — Transfer of Care (Signed)
Immediate Anesthesia Transfer of Care Note  Patient: Richard Torres  Procedure(s) Performed: Right Lumbar Five Sacral OneExtraforaminal microdiscectomy (Right Back)  Patient Location: PACU  Anesthesia Type:General  Level of Consciousness: awake, alert , oriented, patient cooperative and responds to stimulation  Airway & Oxygen Therapy: Patient Spontanous Breathing and Patient connected to face mask oxygen  Post-op Assessment: Report given to RN, Post -op Vital signs reviewed and stable and Patient moving all extremities X 4  Post vital signs: Reviewed and stable  Last Vitals:  Vitals Value Taken Time  BP 120/70 03/08/20 0920  Temp    Pulse 65 03/08/20 0921  Resp 17 03/08/20 0920  SpO2 100 % 03/08/20 0921  Vitals shown include unvalidated device data.  Last Pain:  Vitals:   03/08/20 0633  PainSc: 2          Complications: No complications documented.

## 2020-03-08 NOTE — Anesthesia Postprocedure Evaluation (Signed)
Anesthesia Post Note  Patient: Richard Torres  Procedure(s) Performed: Right Lumbar Five Sacral OneExtraforaminal microdiscectomy (Right Back)     Patient location during evaluation: PACU Anesthesia Type: General Level of consciousness: awake and alert Pain management: pain level controlled Vital Signs Assessment: post-procedure vital signs reviewed and stable Respiratory status: spontaneous breathing, nonlabored ventilation, respiratory function stable and patient connected to nasal cannula oxygen Cardiovascular status: blood pressure returned to baseline and stable Postop Assessment: no apparent nausea or vomiting Anesthetic complications: no   No complications documented.  Last Vitals:  Vitals:   03/08/20 1100 03/08/20 1131  BP:  130/83  Pulse: 62 63  Resp: 18 18  Temp: 36.4 C 36.4 C  SpO2: 95% 100%    Last Pain:  Vitals:   03/08/20 1131  TempSrc: Oral  PainSc:                  March Rummage Jorden Minchey

## 2020-03-08 NOTE — Discharge Summary (Signed)
Physician Discharge Summary  Patient ID: Richard Torres MRN: [id number] DOB/AGE: 72-Oct-1947 72 y.o.  Admit date: 03/08/2020 Discharge date: 03/08/2020  Admission Diagnoses: Herniated nucleus pulposus of lumbosacral region (far lateral HNP L 5 S 1 right), spondylosis, DDD, lumbago, radiculopathy   Discharge Diagnoses: Herniated nucleus pulposus of lumbosacral region (far lateral HNP L 5 S 1 right), spondylosis, DDD, lumbago, radiculopathy  Active Problems:   Herniated lumbar disc without myelopathy   Discharged Condition: good  Hospital Course: The patient tolerated the procedure well and was taken to the recovery room in stable condition. The hospital course was routine. There were no complications. The wound remained clean dry and intact. Pt had appropriate back soreness. No complaints of leg pain or new N/T/W. The patient remained afebrile with stable vital signs, and ambulated well. Pain is controlled with p.o. medications and he is requesting to be discharged.  Consults: None  Significant Diagnostic Studies: radiology: X-Rays  Treatments: surgery: Right Lumbar Five Sacral OneExtraforaminal microdiscectomy (Right) Metx far lateral microdiscectomy  Discharge Exam: Blood pressure 130/83, pulse 63, temperature 97.6 F (36.4 C), temperature source Oral, resp. rate 18, height 6\' 1"  (1.854 m), weight 83.9 kg, SpO2 100 %.  Physical Exam: He is A/O X4, conversant, and in good spirits. Reports only mild incisional discomfort. MAEW with good strength that is symmetric bilaterally. Dressing is CDI. Incision is well approximated with no erythema, edema, or drainage.   Disposition: Discharge disposition: 01-Home or Self Care        Allergies as of 03/08/2020      Reactions   Shellfish Allergy Other (See Comments)   lightheaded      Medication List    TAKE these medications   acetaminophen 325 MG tablet Commonly known as: TYLENOL Take 650 mg by mouth every 6 (six) hours as  needed for mild pain or moderate pain.   aspirin 81 MG tablet Take 162 mg by mouth daily.   atorvastatin 20 MG tablet Commonly known as: LIPITOR Take 20 mg by mouth every evening.   cholecalciferol 1000 units tablet Commonly known as: VITAMIN D Take 2,000 Units by mouth every evening.   fenofibrate 145 MG tablet Commonly known as: TRICOR Take 145 mg by mouth every evening.   glipiZIDE 2.5 MG 24 hr tablet Commonly known as: GLUCOTROL XL Take 2.5 mg by mouth daily.   HYDROcodone-acetaminophen 5-325 MG tablet Commonly known as: NORCO/VICODIN Take 1 tablet by mouth every 6 (six) hours as needed for moderate pain.   metFORMIN 750 MG 24 hr tablet Commonly known as: GLUCOPHAGE-XR Take 750 mg by mouth 2 (two) times daily.   methylPREDNISolone 4 MG Tbpk tablet Commonly known as: MEDROL DOSEPAK Take 4 mg by mouth daily. Taper   metoprolol tartrate 25 MG tablet Commonly known as: LOPRESSOR Take 25 mg by mouth 2 (two) times daily.   sertraline 100 MG tablet Commonly known as: ZOLOFT Take 100 mg by mouth every evening.   telmisartan 40 MG tablet Commonly known as: MICARDIS Take 40 mg by mouth daily.       Follow-up Information    Erline Levine, MD Follow up.   Specialty: Neurosurgery Contact information: [address] [place] [address] [phone]               Signed: Marvis Moeller, DNP, NP-C 03/08/2020, 2:14 PM

## 2020-03-08 NOTE — Anesthesia Preprocedure Evaluation (Addendum)
Anesthesia Evaluation  Patient identified by MRN, date of birth, ID band Patient awake    Reviewed: Patient's Chart, lab work & pertinent test results  Airway Mallampati: II  TM Distance: >3 FB Neck ROM: Full    Dental  (+) Teeth Intact   Pulmonary neg pulmonary ROS,    Pulmonary exam normal        Cardiovascular hypertension, + DVT   Rhythm:Regular Rate:Normal     Neuro/Psych  Headaches, CVA negative psych ROS   GI/Hepatic negative GI ROS, Neg liver ROS,   Endo/Other  diabetes, Well Controlled, Type 2  Renal/GU CRFRenal disease   Prostate Ca    Musculoskeletal  (+) Arthritis , Osteoarthritis,    Abdominal (+)  Abdomen: soft. Bowel sounds: normal.  Peds  Hematology  (+) anemia ,   Anesthesia Other Findings   Reproductive/Obstetrics                            Anesthesia Physical Anesthesia Plan  ASA: III  Anesthesia Plan: General   Post-op Pain Management:    Induction: Intravenous  PONV Risk Score and Plan: 2 and Ondansetron, Dexamethasone and Treatment may vary due to age or medical condition  Airway Management Planned: Mask and Oral ETT  Additional Equipment: None  Intra-op Plan:   Post-operative Plan: Extubation in OR  Informed Consent: I have reviewed the patients History and Physical, chart, labs and discussed the procedure including the risks, benefits and alternatives for the proposed anesthesia with the patient or authorized representative who has indicated his/her understanding and acceptance.     Dental advisory given  Plan Discussed with: CRNA and Surgeon  Anesthesia Plan Comments: (Lab Results      Component                Value               Date                      WBC                      10.1                03/08/2020                HGB                      17.3 (H)            03/08/2020                HCT                      52.6 (H)             03/08/2020                MCV                      94.3                03/08/2020                PLT                      319  03/08/2020           Lab Results      Component                Value               Date                      NA                       132 (L)             07/21/2011                K                        3.8                 07/21/2011                CO2                      25                  07/21/2011                GLUCOSE                  140 (H)             07/21/2011                BUN                      17                  07/21/2011                CREATININE               1.40 (H)            07/21/2011                CALCIUM                  9.0                 07/21/2011                GFRNONAA                 52 (L)              07/21/2011                GFRAA                    60 (L)              07/21/2011          )       Anesthesia Quick Evaluation

## 2020-03-08 NOTE — Op Note (Signed)
03/08/2020  9:13 AM  PATIENT:  Richard Torres  74 y.o. male  PRE-OPERATIVE DIAGNOSIS:  Herniated nucleus pulposus of lumbosacral region (far lateral HNP L 5 S 1 right), spondylosis, DDD, lumbago, radiculopathy   POST-OPERATIVE DIAGNOSIS:  Herniated nucleus pulposus of lumbosacral region (far lateral HNP L 5 S 1 right), spondylosis, DDD, lumbago, radiculopathy   PROCEDURE:  Procedure(s): Right Lumbar Five Sacral OneExtraforaminal microdiscectomy (Right) Metx far lateral microdiscectomy  SURGEON:  Surgeon(s) and Role:    Erline Levine, MD - Primary  PHYSICIAN ASSISTANT: Glenford Peers, NP  ASSISTANTS: P[place]at, RN   ANESTHESIA:   general  EBL:  25 mL   BLOOD ADMINISTERED:none  DRAINS: none   LOCAL MEDICATIONS USED:  MARCAINE    and LIDOCAINE   SPECIMEN:  No Specimen  DISPOSITION OF SPECIMEN:  N/A  COUNTS:  YES  TOURNIQUET:  * No tourniquets in log *  DICTATION: DICTATION: Patient has right L 5 radiculopathy with  extra-foraminal disc herniation L 5 S 1 right, who has not improved with conservative management.  He is unable to stand and has been in a wheelchair due to severe radicular pain.  PROCEDURE: Patient was brought to the operating room and following smooth and uncomplicated induction of general endotracheal anesthesia, patient was placed in prone position on Wilson frame.  Back was prepped and draped in the usual fashion with Duraprep.  Using C -arm fluoroscopy, the right L 5 S 1 level was localized.  Skin and subcutaneous tissues were infiltrated with lidocaine.  An incision was made and using sequential dilators and the expandable retractor, the right L 5 S 1 pars and interspace were exposed.  Using microscope, the right L 5 pars was thinned with the high speed drill and then the lateral aspect was removed with Kerrison rongeurs, along with the lateral facet joint complex and superior aspect of the sacral ala.  The intertransverse ligament was removed and the lateral  aspect of the L 5 nerve was identified.  The heaped up annulus of the disc space was identified and incised and a thorough discectomy was performed with removal of multiple subligamentous compressive fragments of disc material.  Laterally herniated disc material was mobilized and removed.  The disc was disrupted over a broad front and disc material was removed from the L 5 nerve root, allowing the nerve to follow a less compressed, more natural trajectory.  The interspace was then irrigated.  The course of the nerve root was then palpated and there did not appear to be any additional compression.  The interspace was bathed in Depo-Medrol and fentanyl. The retractor was removed after hemostasis was assured and the fascia was closed with ) vicryl sutures, the subcutaneous tissues with 2-0 vicryl sutures and the skin edges with 3-0 vicryl sutures.  The wound was dressed with Dermabond and an occlusive dressing.  Patient was extubated and taken to Recovery in stable and satisfactory condition.  Counts were correct at the end of the case.   PLAN OF CARE: Admit for overnight observation  PATIENT DISPOSITION:  PACU - hemodynamically stable.   Delay start of Pharmacological VTE agent (>24hrs) due to surgical blood loss or risk of bleeding: yes

## 2020-03-08 NOTE — Interval H&P Note (Signed)
History and Physical Interval Note:  03/08/2020 7:23 AM  Richard Torres  has presented today for surgery, with the diagnosis of Herniated nucleus pulposus of lumbosacral region.  The various methods of treatment have been discussed with the patient and family. After consideration of risks, benefits and other options for treatment, the patient has consented to  Procedure(s) with comments: Right Lumbar 5 Sacral 1 Extraforaminal microdiscectomy (Right) - 3C/RM 18 as a surgical intervention.  The patient's history has been reviewed, patient examined, no change in status, stable for surgery.  I have reviewed the patient's chart and labs.  Questions were answered to the patient's satisfaction.     Peggyann Shoals

## 2020-03-08 NOTE — Progress Notes (Signed)
OT Cancellation Note and Discharge  Patient Details Name: Richard Torres MRN: [id number] DOB: 1946-01-30   Cancelled Treatment:    Reason Eval/Treat Not Completed: OT screened, no needs identified, will sign off  Jaci Carrel 03/08/2020, 12:35 PM   Jesse Sans OTR/L Acute Rehabilitation Services Pager: [phone] Office: [phone]

## 2020-03-08 NOTE — Discharge Instructions (Signed)
Wound Care Remove outside dressing in 2-3 days Leave incision open to air. You may shower. Do not scrub directly on incision.  Do not put any creams, lotions, or ointments on incision. Activity Walk each and every day, increasing distance each day. No lifting greater than 5 lbs.  Avoid bending, arching, and twisting. No driving for 2 weeks; may ride as a passenger locally. If provided with back brace, wear when out of bed.  It is not necessary to wear in bed. Diet Resume your normal diet.  Return to Work Will be discussed at you follow up appointment. Call Your Doctor If Any of These Occur Redness, drainage, or swelling at the wound.  Temperature greater than 101 degrees. Severe pain not relieved by pain medication. Incision starts to come apart. Follow Up Appt Call today for appointment in 2-3 weeks ([phone]) or for problems.  If you have any hardware placed in your spine, you will need an x-ray before your appointment.

## 2020-03-08 NOTE — Plan of Care (Signed)
Patient alert and oriented, mae's well, voiding adequate amount of urine, swallowing without difficulty, no c/o pain at time of discharge. Patient discharged home with family. Script and discharged instructions given to patient. Patient and family stated understanding of instructions given. Patient has an appointment with Dr.

## 2020-03-08 NOTE — Evaluation (Signed)
Physical Therapy Evaluation and discharge Patient Details Name: Richard Torres MRN: [id number] DOB: 26-Mar-1946 Today's Date: 03/08/2020   History of Present Illness  Pt is a 74 y/o male s/p L5-S1 microdiskectomy. PMH includes HTN, DM, DVT, and CVA  Clinical Impression  Patient evaluated by Physical Therapy with no further acute PT needs identified. All education has been completed and the patient has no further questions. Pt overall at an independent level for mobility tasks. No LOB noted with gait or stair navigation. Educated about back precautions, walking program, and how to maintain precautions during ADL tasks. Reports his wife will be able to assist at d/c if needed. See below for any follow-up Physical Therapy or equipment needs. PT is signing off. Thank you for this referral. If needs change, please re-consult.     Follow Up Recommendations No PT follow up    Equipment Recommendations  None recommended by PT    Recommendations for Other Services       Precautions / Restrictions Precautions Precautions: Back Precaution Booklet Issued: Yes (comment) Precaution Comments: reviewed back precautions with pt and how to maintain during ADL tasks.  Restrictions Weight Bearing Restrictions: No      Mobility  Bed Mobility               General bed mobility comments: Standing in room upon entry. Verbally reviewed log roll technique.   Transfers Overall transfer level: Independent                  Ambulation/Gait Ambulation/Gait assistance: Independent Gait Distance (Feet): 400 Feet Assistive device: None Gait Pattern/deviations: WFL(Within Functional Limits) Gait velocity: WFL   General Gait Details: Good gait speed, and overall steady. Educated about generalized walking program to perform at home.   Stairs Stairs: Yes Stairs assistance: Independent Stair Management: No rails;Forwards;Alternating pattern Number of Stairs: 3 General stair comments:  Steady stair navigation without using rail and with alternating pattern. No LOB noted.   Wheelchair Mobility    Modified Rankin (Stroke Patients Only)       Balance Overall balance assessment: No apparent balance deficits (not formally assessed)                                           Pertinent Vitals/Pain Pain Assessment: Faces Faces Pain Scale: Hurts a little bit Pain Location: back Pain Descriptors / Indicators: Operative site guarding Pain Intervention(s): Limited activity within patient's tolerance;Monitored during session;Repositioned    Home Living Family/patient expects to be discharged to:: Private residence Living Arrangements: Spouse/significant other;Children Available Help at Discharge: Family Type of Home: House Home Access: Stairs to enter Entrance Stairs-Rails: None Entrance Stairs-Number of Steps: 2 Home Layout: Two level;Able to live on main level with bedroom/bathroom Home Equipment: Shower seat      Prior Function Level of Independence: Independent               Hand Dominance        Extremity/Trunk Assessment   Upper Extremity Assessment Upper Extremity Assessment: Overall WFL for tasks assessed    Lower Extremity Assessment Lower Extremity Assessment: RLE deficits/detail RLE Deficits / Details: Reports RLE pain has improved.     Cervical / Trunk Assessment Cervical / Trunk Assessment: Other exceptions Cervical / Trunk Exceptions: s/p lumbar surgery  Communication   Communication: No difficulties  Cognition Arousal/Alertness: Awake/alert Behavior During Therapy: WFL for tasks assessed/performed Overall  Cognitive Status: Within Functional Limits for tasks assessed                                        General Comments General comments (skin integrity, edema, etc.): Educated about how to perform car transfer and ADL tasks while maintaining precautions.     Exercises     Assessment/Plan     PT Assessment Patent does not need any further PT services  PT Problem List         PT Treatment Interventions      PT Goals (Current goals can be found in the Care Plan section)  Acute Rehab PT Goals Patient Stated Goal: to go home PT Goal Formulation: With patient Time For Goal Achievement: 03/08/20 Potential to Achieve Goals: Good    Frequency     Barriers to discharge        Co-evaluation               AM-PAC PT "6 Clicks" Mobility  Outcome Measure Help needed turning from your back to your side while in a flat bed without using bedrails?: None Help needed moving from lying on your back to sitting on the side of a flat bed without using bedrails?: None Help needed moving to and from a bed to a chair (including a wheelchair)?: None Help needed standing up from a chair using your arms (e.g., wheelchair or bedside chair)?: None Help needed to walk in hospital room?: None Help needed climbing 3-5 steps with a railing? : None 6 Click Score: 24    End of Session   Activity Tolerance: Patient tolerated treatment well Patient left: in chair;with call bell/phone within reach;with family/visitor present Nurse Communication: Mobility status PT Visit Diagnosis: Other abnormalities of gait and mobility (R26.89)    Time: 2637-8588 PT Time Calculation (min) (ACUTE ONLY): 12 min   Charges:   PT Evaluation $PT Eval Low Complexity: 1 Low          Lou Miner, DPT  Acute Rehabilitation Services  Pager: [phone] Office: [phone]   Rudean Hitt 03/08/2020, 1:00 PM

## 2020-03-08 NOTE — H&P (Signed)
Patient ID:   [id number] Patient: Richard Torres  Date of Birth: 1946/03/08 Visit Type: Office Visit   Date: 03/04/2020 08:30 AM Provider: Marchia Meiers. Vertell Limber MD   This 74 year old male presents for MRI Review.  HISTORY OF PRESENT ILLNESS: 1.  MRI Review  Mr. Pfiffner returns today to review his STAT MRI.  He continues to be suffering from excruciating right-sided low back pain with a right lower extremity radiculopathy.  He continues to have to use a wheelchair due to the neurogenic claudication.      Medical/Surgical/Interim History Reviewed, no change.  Last detailed document date:03/03/2020.     Family History: Reviewed, no changes.  Last detailed document date:03/03/2020.   Social History: Reviewed, no changes. Last detailed document date: 03/03/2020.    MEDICATIONS: (added, continued or stopped this visit) Started Medication Directions Instruction Stopped 03/03/2020 Medrol (Pak) 4 mg tablets in a dose pack take 1 box by oral route  as directed per package instructions      ALLERGIES: Ingredient Reaction Medication Name Comment NO KNOWN ALLERGIES    No known allergies. Reviewed, no changes.    PHYSICAL EXAM:  Vitals Date Temp F BP Pulse Ht In Wt Lb BMI BSA Pain Score 03/04/2020  107/72 72 73    10/10   PHYSICAL EXAM Details General Level of Distress: no acute distress Overall Appearance: normal    Cardiovascular Cardiac: regular rate and rhythm without murmur  Respiratory Lungs: clear to auscultation  Neurological Recent and Remote Memory: normal Attention Span and Concentration:   normal Language: normal Fund of Knowledge: normal  Right Left Sensation: normal normal Upper Extremity Coordination: normal normal  Lower Extremity Coordination: normal normal  Musculoskeletal Gait and Station: normal  Right Left Upper Extremity Muscle Strength: normal normal Lower Extremity Muscle  Strength: normal normal Upper Extremity Muscle Tone:  normal normal Lower Extremity Muscle Tone: normal normal   Motor Strength Upper and lower extremity motor strength was tested in the clinically pertinent muscles.     Deep Tendon Reflexes  Right Left Biceps: normal normal Triceps: normal normal Brachioradialis: normal normal Patellar: normal normal Achilles: normal normal  Sensory Sensation was tested at L1 to S1.   Cranial Nerves II. Optic Nerve/Visual Fields: normal III. Oculomotor: normal IV. Trochlear: normal V. Trigeminal: normal VI. Abducens: normal VII. Facial: normal VIII. Acoustic/Vestibular: normal IX. Glossopharyngeal: normal X. Vagus: normal XI. Spinal Accessory: normal XII. Hypoglossal: normal  Motor and other Tests Lhermittes: negative Rhomberg: negative    Right Left Hoffman's: normal normal Clonus: normal normal Babinski: normal normal SLR: negative negative Patrick's Corky Sox): negative negative Toe Walk: normal normal Toe Lift: normal normal Heel Walk: normal normal SI Joint: nontender nontender     DIAGNOSTIC RESULTS:  Noncontrast lumbar MRI reviewed with patient in the office this morning. He has a large far lateral herniated disc on the right at L5/S1. There is severe disc degeneration at the L3/4 and L4/5 levels with mild to moderate bilateral foraminal stenosis, Left greater than Right.    IMPRESSION:  The patient has a far lateral herniated disc on the right side of L5/S1 that is causing impingement on the exiting nerve route and causing him severe 10+/10 right-sided low back pain and a RLE radiculopathy with neurogenic claudication.  I have recommended proceeding with a right-sided L5-S1 extraforaminal microdiskectomy with [facility].  Risks and benefits were discussed in detail with the patient and he wishes to proceed with surgery.  PLAN: Patient will undergo a right-sided extraforaminal microdiscectomy with [facility]  at [facility] on 03/08/2020.  Detailed patient Education was performed today.  He will follow-up in the clinic 3 weeks after his procedure has been completed.   Assessment/Plan  # Detail Type Description  1. Assessment Herniated nucleus pulposus of lumbosacral region (M51.27).     2. Assessment Disc degeneration, lumbar (M51.36).     3. Assessment Radiculopathy, lumbar region (M54.16).     4. Assessment Neurogenic claudication (G95.19).     5. Assessment Lumbar spondylosis (M47.816).     6. Assessment Acute right-sided low back pain with right-sided sciatica (M54.41).       Pain Management Plan Pain Scale: 10/10. Method: Numeric Pain Intensity Scale. Location: back. Onset: 03/03/2020. Duration: varies. Quality: discomforting. Pain management follow-up plan of care: Patient will continue medication management..              Provider:  Marchia Meiers. Vertell Limber MD  03/04/2020 02:54 PM    Dictation edited by: Fenton Malling, NP    CC Providers: [facility] at [address],  [place]  [id number]-   Robert Gates  [facility] at [address], [place] [address]-               Electronically signed by Fenton Malling NP on 03/04/2020 02:54 PM Patient ID:   [id number] Patient: Richard Torres  Date of Birth: 1946-01-08 Visit Type: Office Visit   Date: 03/03/2020 10:00 AM Provider: Marchia Meiers. Vertell Limber MD   This 74 year old male presents for sciatica.  HISTORY OF PRESENT ILLNESS: 1.  sciatica  Mr. Spilde is a 74 year old male who was referred for neurosurgical evaluation by Dr. Inda Merlin for an acute onset of right-sided low back pain and right lower extremity radiculopathy.  The patient reports that he has had a history of chronic low back pain for many years with intermittent exacerbations that have been able to be managed conservatively.  About 1  and half weeks ago the patient reports an acute onset of severe right-sided low back pain that radiated down into his right posterior leg and into the sole of his foot.  He initially received an IM injection of Toradol, cyclobenzaprine, and ketorolac tromethamine which offered no relief.  His symptoms continue to worsen and was then given a prescription for gabapentin, which also provided no relief.  His symptoms continue to worsen and began to be a constant 10/10 pain with an inability to bear weight on his right lower extremity.  Today, the patient was given a prescription of Norco by his primary care provider for his pain.  The patient presents to his appointments today with his wife and is in a wheelchair due to his severe debilitating pain and inability to walk.  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 Preferred language is Unknown.   Tobacco use status: Current non-smoker. Smoking status: Never smoker.  SMOKING STATUS Type Smoking Status Usage Per Day Years Used Total Pack Years  Never smoker         MEDICATIONS: (added, continued or stopped this visit) Started Medication Directions Instruction Stopped 03/03/2020 Medrol (Pak) 4 mg tablets in  a dose pack take 1 box by oral route  as directed per package instructions      ALLERGIES: Ingredient Reaction Medication Name Comment NO KNOWN ALLERGIES    No known allergies. Reviewed, updated.    PHYSICAL EXAM:  Vitals Date Temp F BP Pulse Ht In Wt Lb BMI BSA Pain Score 03/03/2020  166/93 71 73 185 24.41  10/10   PHYSICAL EXAM Details General Level of Distress: no acute distress Overall  Appearance: normal    Cardiovascular Cardiac: regular rate and rhythm without murmur  Respiratory Lungs: clear to auscultation  Neurological Recent and Remote Memory: normal Attention Span and Concentration:   normal Language: normal Fund of Knowledge: normal  Right Left Sensation: normal normal Upper Extremity Coordination: normal normal  Lower Extremity Coordination: normal normal  Musculoskeletal Gait and Station: normal  Right Left Upper Extremity Muscle Strength: normal normal Lower Extremity Muscle Strength: normal normal Upper Extremity Muscle Tone:  normal normal Lower Extremity Muscle Tone: normal normal   Motor Strength Upper and lower extremity motor strength was tested in the clinically pertinent muscles.     Deep Tendon Reflexes  Right Left Biceps: normal normal Triceps: normal normal Brachioradialis: normal normal Patellar: normal normal Achilles: normal normal  Sensory Sensation was tested at L1 to S1.   Cranial Nerves II. Optic Nerve/Visual Fields: normal III. Oculomotor: normal IV. Trochlear: normal V. Trigeminal: normal VI. Abducens: normal VII. Facial: normal VIII. Acoustic/Vestibular: normal IX. Glossopharyngeal: normal X. Vagus: normal XI. Spinal Accessory: normal XII. Hypoglossal: normal  Motor and other Tests Lhermittes: negative Rhomberg: negative    Right Left Hoffman's: normal normal Clonus: normal normal Babinski: normal normal SLR: negative negative Patrick's Corky Sox): negative negative Toe Walk: normal normal Toe Lift: normal normal Heel Walk: normal normal SI Joint: nontender nontender     DIAGNOSTIC RESULTS:  Four view lumbar radiographs performed and reviewed in the office today.  The patient has spondylosis throughout his lumbar spine with no significant structural abnormality that would be associated with his symptoms.  He does have a mild dextroscoliosis.    IMPRESSION:  The patient is having severe  debilitating 10/10 pain with neurogenic claudication.  He has full strength on confrontational testing.  His symptoms and exam are highly suspicious for a large herniated nucleus pulposus.  PLAN: Due to the patient's severe debilitating pain and inability to ambulate, he will undergo a stat noncontrast lumbar MRI and follow-up with [place] in the clinic as soon as his imaging has been completed.  A Medrol Dosepak was called in for the patient.  He will begin taking Norco that was prescribed by Dr. Inda Merlin.  Orders: Diagnostic Procedures: Assessment Procedure M54.16 Lumbar Spine- AP/Lat/Flex/Ex M54.16 MRI Spine/lumb W/o Contrast  Completed Orders (this encounter) Order Details Reason Side Interpretation Result Initial Treatment Date Region Lumbar Spine- AP/Lat/Flex/Ex      03/03/2020 All Levels to All Levels  Assessment/Plan  # Detail Type Description  1. Assessment Acute right-sided low back pain with right-sided sciatica (M54.41).     2. Assessment Lumbar spondylosis (M47.816).     3. Assessment Neurogenic claudication (G95.19).     4. Assessment Lumbar radiculopathy, acute (M54.16).          MEDICATIONS PRESCRIBED TODAY    Rx Quantity Refills MEDROL 4 mg  1 0           Provider:  Marchia Meiers. Vertell Limber MD  03/08/2020 07:23 AM    Dictation edited by: Marchia Meiers. Vertell Limber    CC Providers: Herbie Baltimore  [facility] at [address],  [place]  [id number]-   Robert Gates  [facility] at [address] [place], [place] [address]-               Electronically signed by Marchia Meiers. Vertell Limber MD on 03/08/2020 07:23 AM  on behalf of Fenton Malling NP

## 2020-03-09 ENCOUNTER — Encounter (HOSPITAL_COMMUNITY): Payer: Self-pay | Admitting: Neurosurgery

## 2020-03-31 DIAGNOSIS — G4700 Insomnia, unspecified: Secondary | ICD-10-CM | POA: Diagnosis not present

## 2020-03-31 DIAGNOSIS — M1990 Unspecified osteoarthritis, unspecified site: Secondary | ICD-10-CM | POA: Diagnosis not present

## 2020-03-31 DIAGNOSIS — I10 Essential (primary) hypertension: Secondary | ICD-10-CM | POA: Diagnosis not present

## 2020-03-31 DIAGNOSIS — F341 Dysthymic disorder: Secondary | ICD-10-CM | POA: Diagnosis not present

## 2020-03-31 DIAGNOSIS — E119 Type 2 diabetes mellitus without complications: Secondary | ICD-10-CM | POA: Diagnosis not present

## 2020-03-31 DIAGNOSIS — E1140 Type 2 diabetes mellitus with diabetic neuropathy, unspecified: Secondary | ICD-10-CM | POA: Diagnosis not present

## 2020-03-31 DIAGNOSIS — N183 Chronic kidney disease, stage 3 unspecified: Secondary | ICD-10-CM | POA: Diagnosis not present

## 2020-03-31 DIAGNOSIS — Z8546 Personal history of malignant neoplasm of prostate: Secondary | ICD-10-CM | POA: Diagnosis not present

## 2020-03-31 DIAGNOSIS — E782 Mixed hyperlipidemia: Secondary | ICD-10-CM | POA: Diagnosis not present

## 2020-03-31 DIAGNOSIS — C61 Malignant neoplasm of prostate: Secondary | ICD-10-CM | POA: Diagnosis not present

## 2020-07-13 DIAGNOSIS — E782 Mixed hyperlipidemia: Secondary | ICD-10-CM | POA: Diagnosis not present

## 2020-07-13 DIAGNOSIS — N183 Chronic kidney disease, stage 3 unspecified: Secondary | ICD-10-CM | POA: Diagnosis not present

## 2020-07-13 DIAGNOSIS — G4700 Insomnia, unspecified: Secondary | ICD-10-CM | POA: Diagnosis not present

## 2020-07-13 DIAGNOSIS — Z8546 Personal history of malignant neoplasm of prostate: Secondary | ICD-10-CM | POA: Diagnosis not present

## 2020-07-13 DIAGNOSIS — C61 Malignant neoplasm of prostate: Secondary | ICD-10-CM | POA: Diagnosis not present

## 2020-07-13 DIAGNOSIS — M1990 Unspecified osteoarthritis, unspecified site: Secondary | ICD-10-CM | POA: Diagnosis not present

## 2020-07-13 DIAGNOSIS — E1140 Type 2 diabetes mellitus with diabetic neuropathy, unspecified: Secondary | ICD-10-CM | POA: Diagnosis not present

## 2020-07-13 DIAGNOSIS — F341 Dysthymic disorder: Secondary | ICD-10-CM | POA: Diagnosis not present

## 2020-07-13 DIAGNOSIS — E119 Type 2 diabetes mellitus without complications: Secondary | ICD-10-CM | POA: Diagnosis not present

## 2020-07-13 DIAGNOSIS — I10 Essential (primary) hypertension: Secondary | ICD-10-CM | POA: Diagnosis not present

## 2020-08-13 DIAGNOSIS — M5136 Other intervertebral disc degeneration, lumbar region: Secondary | ICD-10-CM | POA: Diagnosis not present

## 2020-08-13 DIAGNOSIS — R030 Elevated blood-pressure reading, without diagnosis of hypertension: Secondary | ICD-10-CM | POA: Diagnosis not present

## 2020-08-13 DIAGNOSIS — M47816 Spondylosis without myelopathy or radiculopathy, lumbar region: Secondary | ICD-10-CM | POA: Diagnosis not present

## 2020-08-13 DIAGNOSIS — G9519 Other vascular myelopathies: Secondary | ICD-10-CM | POA: Diagnosis not present

## 2020-08-13 DIAGNOSIS — M5127 Other intervertebral disc displacement, lumbosacral region: Secondary | ICD-10-CM | POA: Diagnosis not present

## 2020-08-13 DIAGNOSIS — M5416 Radiculopathy, lumbar region: Secondary | ICD-10-CM | POA: Diagnosis not present

## 2020-08-13 DIAGNOSIS — M5441 Lumbago with sciatica, right side: Secondary | ICD-10-CM | POA: Diagnosis not present

## 2020-09-14 DIAGNOSIS — H35413 Lattice degeneration of retina, bilateral: Secondary | ICD-10-CM | POA: Diagnosis not present

## 2020-09-14 DIAGNOSIS — E119 Type 2 diabetes mellitus without complications: Secondary | ICD-10-CM | POA: Diagnosis not present

## 2020-09-14 DIAGNOSIS — H35373 Puckering of macula, bilateral: Secondary | ICD-10-CM | POA: Diagnosis not present

## 2020-09-14 DIAGNOSIS — H26493 Other secondary cataract, bilateral: Secondary | ICD-10-CM | POA: Diagnosis not present

## 2020-09-14 DIAGNOSIS — H524 Presbyopia: Secondary | ICD-10-CM | POA: Diagnosis not present

## 2020-09-14 DIAGNOSIS — H52223 Regular astigmatism, bilateral: Secondary | ICD-10-CM | POA: Diagnosis not present

## 2020-09-14 DIAGNOSIS — H35033 Hypertensive retinopathy, bilateral: Secondary | ICD-10-CM | POA: Diagnosis not present

## 2020-09-15 DIAGNOSIS — E1140 Type 2 diabetes mellitus with diabetic neuropathy, unspecified: Secondary | ICD-10-CM | POA: Diagnosis not present

## 2020-09-15 DIAGNOSIS — Z1211 Encounter for screening for malignant neoplasm of colon: Secondary | ICD-10-CM | POA: Diagnosis not present

## 2020-09-15 DIAGNOSIS — Z8546 Personal history of malignant neoplasm of prostate: Secondary | ICD-10-CM | POA: Diagnosis not present

## 2020-09-15 DIAGNOSIS — Z0000 Encounter for general adult medical examination without abnormal findings: Secondary | ICD-10-CM | POA: Diagnosis not present

## 2020-09-15 DIAGNOSIS — E559 Vitamin D deficiency, unspecified: Secondary | ICD-10-CM | POA: Diagnosis not present

## 2020-09-15 DIAGNOSIS — G4700 Insomnia, unspecified: Secondary | ICD-10-CM | POA: Diagnosis not present

## 2020-09-15 DIAGNOSIS — Z23 Encounter for immunization: Secondary | ICD-10-CM | POA: Diagnosis not present

## 2020-09-15 DIAGNOSIS — I10 Essential (primary) hypertension: Secondary | ICD-10-CM | POA: Diagnosis not present

## 2020-09-15 DIAGNOSIS — Z1389 Encounter for screening for other disorder: Secondary | ICD-10-CM | POA: Diagnosis not present

## 2020-09-15 DIAGNOSIS — I679 Cerebrovascular disease, unspecified: Secondary | ICD-10-CM | POA: Diagnosis not present

## 2020-09-15 DIAGNOSIS — E119 Type 2 diabetes mellitus without complications: Secondary | ICD-10-CM | POA: Diagnosis not present

## 2020-09-15 DIAGNOSIS — F341 Dysthymic disorder: Secondary | ICD-10-CM | POA: Diagnosis not present

## 2020-09-15 DIAGNOSIS — Z79899 Other long term (current) drug therapy: Secondary | ICD-10-CM | POA: Diagnosis not present

## 2020-09-15 DIAGNOSIS — F322 Major depressive disorder, single episode, severe without psychotic features: Secondary | ICD-10-CM | POA: Diagnosis not present

## 2020-09-15 DIAGNOSIS — E782 Mixed hyperlipidemia: Secondary | ICD-10-CM | POA: Diagnosis not present

## 2020-09-15 DIAGNOSIS — N183 Chronic kidney disease, stage 3 unspecified: Secondary | ICD-10-CM | POA: Diagnosis not present

## 2020-09-15 DIAGNOSIS — M1990 Unspecified osteoarthritis, unspecified site: Secondary | ICD-10-CM | POA: Diagnosis not present

## 2020-09-27 DIAGNOSIS — Z23 Encounter for immunization: Secondary | ICD-10-CM | POA: Diagnosis not present

## 2020-09-29 DIAGNOSIS — N183 Chronic kidney disease, stage 3 unspecified: Secondary | ICD-10-CM | POA: Diagnosis not present

## 2020-09-29 DIAGNOSIS — Z8546 Personal history of malignant neoplasm of prostate: Secondary | ICD-10-CM | POA: Diagnosis not present

## 2020-09-29 DIAGNOSIS — I10 Essential (primary) hypertension: Secondary | ICD-10-CM | POA: Diagnosis not present

## 2020-09-29 DIAGNOSIS — E782 Mixed hyperlipidemia: Secondary | ICD-10-CM | POA: Diagnosis not present

## 2020-09-29 DIAGNOSIS — E1140 Type 2 diabetes mellitus with diabetic neuropathy, unspecified: Secondary | ICD-10-CM | POA: Diagnosis not present

## 2020-09-29 DIAGNOSIS — M1990 Unspecified osteoarthritis, unspecified site: Secondary | ICD-10-CM | POA: Diagnosis not present

## 2020-09-29 DIAGNOSIS — E119 Type 2 diabetes mellitus without complications: Secondary | ICD-10-CM | POA: Diagnosis not present

## 2020-09-29 DIAGNOSIS — G4700 Insomnia, unspecified: Secondary | ICD-10-CM | POA: Diagnosis not present

## 2020-09-29 DIAGNOSIS — F341 Dysthymic disorder: Secondary | ICD-10-CM | POA: Diagnosis not present

## 2020-09-29 DIAGNOSIS — F322 Major depressive disorder, single episode, severe without psychotic features: Secondary | ICD-10-CM | POA: Diagnosis not present

## 2020-09-29 DIAGNOSIS — C61 Malignant neoplasm of prostate: Secondary | ICD-10-CM | POA: Diagnosis not present

## 2020-11-26 DIAGNOSIS — F39 Unspecified mood [affective] disorder: Secondary | ICD-10-CM | POA: Diagnosis not present

## 2020-11-26 DIAGNOSIS — R41840 Attention and concentration deficit: Secondary | ICD-10-CM | POA: Diagnosis not present

## 2020-12-03 DIAGNOSIS — F322 Major depressive disorder, single episode, severe without psychotic features: Secondary | ICD-10-CM | POA: Diagnosis not present

## 2020-12-03 DIAGNOSIS — G4700 Insomnia, unspecified: Secondary | ICD-10-CM | POA: Diagnosis not present

## 2020-12-03 DIAGNOSIS — E782 Mixed hyperlipidemia: Secondary | ICD-10-CM | POA: Diagnosis not present

## 2020-12-03 DIAGNOSIS — I10 Essential (primary) hypertension: Secondary | ICD-10-CM | POA: Diagnosis not present

## 2020-12-03 DIAGNOSIS — M1990 Unspecified osteoarthritis, unspecified site: Secondary | ICD-10-CM | POA: Diagnosis not present

## 2020-12-03 DIAGNOSIS — E119 Type 2 diabetes mellitus without complications: Secondary | ICD-10-CM | POA: Diagnosis not present

## 2020-12-03 DIAGNOSIS — N183 Chronic kidney disease, stage 3 unspecified: Secondary | ICD-10-CM | POA: Diagnosis not present

## 2020-12-03 DIAGNOSIS — F341 Dysthymic disorder: Secondary | ICD-10-CM | POA: Diagnosis not present

## 2020-12-03 DIAGNOSIS — E1140 Type 2 diabetes mellitus with diabetic neuropathy, unspecified: Secondary | ICD-10-CM | POA: Diagnosis not present

## 2021-02-14 DIAGNOSIS — Z23 Encounter for immunization: Secondary | ICD-10-CM | POA: Diagnosis not present

## 2021-02-21 DIAGNOSIS — Z23 Encounter for immunization: Secondary | ICD-10-CM | POA: Diagnosis not present

## 2021-03-17 DIAGNOSIS — R41840 Attention and concentration deficit: Secondary | ICD-10-CM | POA: Diagnosis not present

## 2021-03-17 DIAGNOSIS — E559 Vitamin D deficiency, unspecified: Secondary | ICD-10-CM | POA: Diagnosis not present

## 2021-03-17 DIAGNOSIS — F322 Major depressive disorder, single episode, severe without psychotic features: Secondary | ICD-10-CM | POA: Diagnosis not present

## 2021-03-17 DIAGNOSIS — I10 Essential (primary) hypertension: Secondary | ICD-10-CM | POA: Diagnosis not present

## 2021-03-17 DIAGNOSIS — N183 Chronic kidney disease, stage 3 unspecified: Secondary | ICD-10-CM | POA: Diagnosis not present

## 2021-03-17 DIAGNOSIS — E782 Mixed hyperlipidemia: Secondary | ICD-10-CM | POA: Diagnosis not present

## 2021-03-17 DIAGNOSIS — I679 Cerebrovascular disease, unspecified: Secondary | ICD-10-CM | POA: Diagnosis not present

## 2021-03-17 DIAGNOSIS — Z7984 Long term (current) use of oral hypoglycemic drugs: Secondary | ICD-10-CM | POA: Diagnosis not present

## 2021-03-17 DIAGNOSIS — E1140 Type 2 diabetes mellitus with diabetic neuropathy, unspecified: Secondary | ICD-10-CM | POA: Diagnosis not present

## 2021-03-17 DIAGNOSIS — Z8546 Personal history of malignant neoplasm of prostate: Secondary | ICD-10-CM | POA: Diagnosis not present

## 2021-03-18 ENCOUNTER — Encounter: Payer: Self-pay | Admitting: Psychology

## 2021-05-10 ENCOUNTER — Emergency Department (HOSPITAL_COMMUNITY): Payer: Medicare Other

## 2021-05-10 ENCOUNTER — Emergency Department (HOSPITAL_COMMUNITY)
Admission: EM | Admit: 2021-05-10 | Discharge: 2021-05-10 | Disposition: A | Discharge status: Home / Self Care | Payer: Medicare Other | Attending: Emergency Medicine | Admitting: Emergency Medicine

## 2021-05-10 ENCOUNTER — Other Ambulatory Visit: Payer: Self-pay

## 2021-05-10 ENCOUNTER — Encounter (HOSPITAL_COMMUNITY): Payer: Self-pay

## 2021-05-10 DIAGNOSIS — Z23 Encounter for immunization: Secondary | ICD-10-CM | POA: Diagnosis not present

## 2021-05-10 DIAGNOSIS — M25562 Pain in left knee: Secondary | ICD-10-CM | POA: Diagnosis not present

## 2021-05-10 DIAGNOSIS — R0789 Other chest pain: Secondary | ICD-10-CM | POA: Diagnosis not present

## 2021-05-10 DIAGNOSIS — Z79899 Other long term (current) drug therapy: Secondary | ICD-10-CM | POA: Insufficient documentation

## 2021-05-10 DIAGNOSIS — N183 Chronic kidney disease, stage 3 unspecified: Secondary | ICD-10-CM | POA: Insufficient documentation

## 2021-05-10 DIAGNOSIS — I129 Hypertensive chronic kidney disease with stage 1 through stage 4 chronic kidney disease, or unspecified chronic kidney disease: Secondary | ICD-10-CM | POA: Diagnosis not present

## 2021-05-10 DIAGNOSIS — W010XXA Fall on same level from slipping, tripping and stumbling without subsequent striking against object, initial encounter: Secondary | ICD-10-CM | POA: Insufficient documentation

## 2021-05-10 DIAGNOSIS — M47812 Spondylosis without myelopathy or radiculopathy, cervical region: Secondary | ICD-10-CM | POA: Diagnosis not present

## 2021-05-10 DIAGNOSIS — E1122 Type 2 diabetes mellitus with diabetic chronic kidney disease: Secondary | ICD-10-CM | POA: Diagnosis not present

## 2021-05-10 DIAGNOSIS — S0990XA Unspecified injury of head, initial encounter: Secondary | ICD-10-CM

## 2021-05-10 DIAGNOSIS — S0081XA Abrasion of other part of head, initial encounter: Secondary | ICD-10-CM | POA: Insufficient documentation

## 2021-05-10 DIAGNOSIS — Y9301 Activity, walking, marching and hiking: Secondary | ICD-10-CM | POA: Insufficient documentation

## 2021-05-10 DIAGNOSIS — R0781 Pleurodynia: Secondary | ICD-10-CM | POA: Diagnosis not present

## 2021-05-10 DIAGNOSIS — W19XXXA Unspecified fall, initial encounter: Secondary | ICD-10-CM

## 2021-05-10 DIAGNOSIS — Z7982 Long term (current) use of aspirin: Secondary | ICD-10-CM | POA: Insufficient documentation

## 2021-05-10 DIAGNOSIS — M7989 Other specified soft tissue disorders: Secondary | ICD-10-CM | POA: Diagnosis not present

## 2021-05-10 DIAGNOSIS — Z7984 Long term (current) use of oral hypoglycemic drugs: Secondary | ICD-10-CM | POA: Diagnosis not present

## 2021-05-10 DIAGNOSIS — I10 Essential (primary) hypertension: Secondary | ICD-10-CM | POA: Diagnosis not present

## 2021-05-10 MED ORDER — LIDOCAINE 5 % EX PTCH: 1.0000 | MEDICATED_PATCH | CUTANEOUS | 0 refills | Status: AC

## 2021-05-10 MED ORDER — LIDOCAINE 5 % EX PTCH
1.0000 | MEDICATED_PATCH | CUTANEOUS | Status: DC
  Administered 2021-05-10: 20:00:00 1 via TRANSDERMAL
  Filled 2021-05-10: qty 1

## 2021-05-10 MED ORDER — TETANUS-DIPHTH-ACELL PERTUSSIS 5-2.5-18.5 LF-MCG/0.5 IM SUSY
0.5000 mL | PREFILLED_SYRINGE | Freq: Once | INTRAMUSCULAR | Status: AC
  Administered 2021-05-10: 18:00:00 0.5 mL via INTRAMUSCULAR
  Filled 2021-05-10: qty 0.5

## 2021-05-10 NOTE — Discharge Instructions (Addendum)
It was a pleasure taking care of you here in the emergency department today.  As discussed in the room your CT scanning of your head and your neck did not show any evidence of acute abnormality.  The x-ray of your ribs do not show evidence of broken bones.  X-ray of your left knee did not show any evidence of broken bones or dislocations however he did have some soft tissue swelling which is to be expected  Make sure you continue to take deep breaths.  Place lidocaine patch to your right ribs ice for the first 48 hours, then switch to heat  Contact your primary care provider to let them know you were seen here as well as your neurosurgeon  Return for new or worsening symptoms.

## 2021-05-10 NOTE — ED Notes (Signed)
Pt reports this is the 3rd time he has fallen in the last month. He states he is not "tripping" but rather having difficulty lifting his left leg forward when walking and it is causing him to fall.

## 2021-05-10 NOTE — ED Notes (Signed)
Pt A&OX4 ambulatory at d/c with independent steady gait, NAD. Pt verbalized understanding of d/c instructions, prescription and follow up care. 

## 2021-05-10 NOTE — ED Triage Notes (Addendum)
Pt reports he fell face first onto concrete today when he tripped while walking. Abrasions noted above right eye and on forehead. Denies taking blood thinners. No LOC. He reports pain to to right rib cage and left knee. A&Ox4 ambulatory. No visible injury noted to right chest.

## 2021-05-10 NOTE — ED Provider Notes (Signed)
[facility] DEPT Provider Note   CSN: [id number] Arrival date & time: 05/10/21  1725     History Chief Complaint  Patient presents with   Richard Torres is a 75 y.o. male with past medical history significant for CVA, CKD presents for evaluation of fall.  States he was walking earlier today when he tripped and fell. He denies any syncope, lightheadedness prior to fall.  Patient states this typically happens when he feels like his left leg does not lift as high as it should causing him to fall. States has been going on over the last month or 2.  Has caused him to fall previously. No back pain, bowel or bladder incontinence, saddle paresthesia. He denies any numbness or weakness in his legs.  No anticoagulation.  He denies any foot drop. Was previously seen by neurosurgery for back pain around a year ago however no current pain.  Admits to mild frontal headache.  Unknown last tetanus.  No vision changes, chest pain, shortness of breath, abdominal pain, emesis.  Admits to right anterior knee pain.  Ambulatory PTA. Denies additional aggravating or alleviating factors.  History obtained from patient and past medical records.  No interpreter is used  HPI     Past Medical History:  Diagnosis Date   Cerebrovascular disease    Chronic kidney disease    stage 3   Clotting disorder (HCC)    hx clot in left leg/right leg   DJD (degenerative joint disease)    DM (diabetes mellitus) ([place])    type 2   Dyslipidemia    Environmental allergies    Erectile dysfunction    HTN (hypertension)    Prostate cancer ([place])    Stroke [facility])     Patient Active Problem List   Diagnosis Date Noted   Herniated lumbar disc without myelopathy 03/08/2020   Incisional hernia, without obstruction or gangrene 02/10/2013   FUO (fever of unknown origin) 07/27/2011   Fever 07/21/2011   Headache(784.0) 07/21/2011   Splenomegaly 07/21/2011   Elevated liver enzymes  07/21/2011   Normocytic anemia 07/21/2011   Renal insufficiency 07/21/2011   Syncope 07/21/2011    Past Surgical History:  Procedure Laterality Date   bilateral inguinal heria repairs     cholecystectomy     CHOLECYSTECTOMY     COLONOSCOPY     EYE SURGERY Bilateral    cataracts removed   HERNIA REPAIR     LUMBAR LAMINECTOMY/ DECOMPRESSION WITH MET-RX Right 03/08/2020   Procedure: Right Lumbar Five Sacral OneExtraforaminal microdiscectomy;  Surgeon: Erline Levine, MD;  Location: [place];  Service: Neurosurgery;  Laterality: Right;   PROSTATECTOMY     VARICOSE VEIN SURGERY Bilateral    laser - legs       Family History  Problem Relation Age of Onset   Cancer Brother        prostate   Cancer Brother        prostate    Social History   Tobacco Use   Smoking status: Never   Smokeless tobacco: Never  Vaping Use   Vaping Use: Never used  Substance Use Topics   Alcohol use: Not Currently    Comment: very little   Drug use: No    Home Medications Prior to Admission medications   Medication Sig Start Date End Date Taking? Authorizing Provider  lidocaine (LIDODERM) 5 % Place 1 patch onto the skin daily. Remove & Discard patch within 12 hours or as  directed by MD 05/10/21  Yes Rockey Guarino A, PA-C  acetaminophen (TYLENOL) 325 MG tablet Take 650 mg by mouth every 6 (six) hours as needed for mild pain or moderate pain.     [provider]  aspirin 81 MG tablet Take 162 mg by mouth daily.     [provider]  atorvastatin (LIPITOR) 20 MG tablet Take 20 mg by mouth every evening.     [provider]  cholecalciferol (VITAMIN D) 1000 UNITS tablet Take 2,000 Units by mouth every evening.     [provider]  fenofibrate (TRICOR) 145 MG tablet Take 145 mg by mouth every evening.     [provider]  glipiZIDE (GLUCOTROL XL) 2.5 MG 24 hr tablet Take 2.5 mg by mouth daily. 01/01/20   [provider]  HYDROcodone-acetaminophen  (NORCO/VICODIN) 5-325 MG tablet Take 1 tablet by mouth every 6 (six) hours as needed for moderate pain.    [provider]  metFORMIN (GLUCOPHAGE-XR) 750 MG 24 hr tablet Take 750 mg by mouth 2 (two) times daily. 02/16/20   [provider]  methylPREDNISolone (MEDROL DOSEPAK) 4 MG TBPK tablet Take 4 mg by mouth daily. Taper 03/03/20   [provider]  metoprolol tartrate (LOPRESSOR) 25 MG tablet Take 25 mg by mouth 2 (two) times daily.    [provider]  sertraline (ZOLOFT) 100 MG tablet Take 100 mg by mouth every evening.     [provider]  telmisartan (MICARDIS) 40 MG tablet Take 40 mg by mouth daily.     [provider]    Allergies    Shellfish allergy  Review of Systems   Review of Systems  Constitutional: Negative.   HENT: Negative.    Respiratory: Negative.    Cardiovascular: Negative.   Gastrointestinal: Negative.   Genitourinary: Negative.   Musculoskeletal: Negative.        Left knee pain, right rib pain  Skin: Negative.   Neurological: Negative.   All other systems reviewed and are negative.  Physical Exam Updated Vital Signs BP 129/72 (BP Location: Left Arm)    Pulse 67    Temp 98.7 F (37.1 C) (Oral)    Resp 18    Ht 6' 1"  (1.854 m)    Wt 82.6 kg    SpO2 98%    BMI 24.01 kg/m   Physical Exam Vitals and nursing note reviewed.  Constitutional:      General: He is not in acute distress.    Appearance: He is well-developed. He is not ill-appearing, toxic-appearing or diaphoretic.  HENT:     Head: Normocephalic.     Jaw: There is normal jaw occlusion.      Comments: Abrasion right superior periorbital area.  No raccoon eyes, battle sign    Nose: Nose normal. No congestion or rhinorrhea.     Mouth/Throat:     Mouth: Mucous membranes are moist.     Comments: Tongue midline, no loose dentition.  No drooling, dysphagia or trismus Eyes:     Extraocular Movements: Extraocular movements intact.      Conjunctiva/sclera: Conjunctivae normal.     Pupils: Pupils are equal, round, and reactive to light.     Comments: No traumatic hyphema, PERRLA, full range of motion without difficulty  Neck:     Trachea: Trachea and phonation normal.     Comments: No midline cervical tenderness Cardiovascular:     Rate and Rhythm: Normal rate and regular rhythm.     Pulses:  Normal pulses.          Radial pulses are 2+ on the right side and 2+ on the left side.     Heart sounds: Normal heart sounds.  Pulmonary:     Effort: Pulmonary effort is normal. No respiratory distress.     Breath sounds: Normal breath sounds and air entry.     Comments: Clear bilaterally, speaks in full sentences without difficulty Chest:     Chest wall: Tenderness present. No lacerations, deformity, swelling, crepitus or edema.       Comments: Tenderness right anterior ribs, no crepitus, step-off, overlying skin changes Abdominal:     General: Bowel sounds are normal. There is no distension.     Palpations: Abdomen is soft.     Tenderness: There is no abdominal tenderness. There is no guarding or rebound.     Hernia: No hernia is present.     Comments: Soft, nontender  Musculoskeletal:        General: Normal range of motion.     Cervical back: Full passive range of motion without pain, normal range of motion and neck supple.     Comments: No midline C/T/L tenderness.  Pelvis stable, nontender palpation.  No shortening rotation of legs.  Able to flex and extend at bilateral upper and lower extremities.  Tenderness to anterior patella left leg, no bony tenderness to tibial plateau, femur  Skin:    General: Skin is warm and dry.     Capillary Refill: Capillary refill takes less than 2 seconds.     Comments: Abrasion right forehead  Neurological:     General: No focal deficit present.     Mental Status: He is alert and oriented to person, place, and time.     Cranial Nerves: Cranial nerves 2-12 are intact.     Sensory:  Sensation is intact.     Motor: Motor function is intact.     Coordination: Coordination is intact.     Gait: Gait is intact.     Comments: Cranial nerves II through XII grossly intact Equal strength bilaterally Intact sensation No foot drop    ED Results / Procedures / Treatments   Labs (all labs ordered are listed, but only abnormal results are displayed) Labs Reviewed - No data to display  EKG EKG Interpretation  Date/Time:  Tuesday May 10 2021 18:03:19 EST Ventricular Rate:  73 PR Interval:  164 QRS Duration: 90 QT Interval:  375 QTC Calculation: 414 R Axis:   -18 Text Interpretation: Sinus rhythm Borderline left axis deviation Abnormal R-wave progression, early transition No significant change since last tracing Confirmed by Calvert Cantor [phone]) on 05/10/2021 6:11:03 PM  Radiology DG Ribs Unilateral W/Chest Right  Result Date: 05/10/2021 CLINICAL DATA:  Fall.  Right rib pain EXAM: RIGHT RIBS AND CHEST - 3+ VIEW COMPARISON:  02/15/2017 FINDINGS: No fracture or other bone lesions are seen involving the ribs. There is no evidence of pneumothorax or pleural effusion. Both lungs are clear. Heart size and mediastinal contours are within normal limits. IMPRESSION: Negative. Electronically Signed   By: Franchot Gallo M.D.   On: 05/10/2021 19:03   CT HEAD WO CONTRAST (5MM)  Result Date: 05/10/2021 CLINICAL DATA:  Fall.  Head trauma EXAM: CT HEAD WITHOUT CONTRAST CT MAXILLOFACIAL WITHOUT CONTRAST CT CERVICAL SPINE WITHOUT CONTRAST TECHNIQUE: Multidetector CT imaging of the head, cervical spine, and maxillofacial structures were performed using the standard protocol without intravenous contrast. Multiplanar CT image reconstructions of the cervical spine and  maxillofacial structures were also generated. COMPARISON:  CT head 02/04/2010 FINDINGS: CT HEAD FINDINGS Brain: No evidence of acute infarction, hemorrhage, hydrocephalus, extra-axial collection or mass lesion/mass effect.  Vascular: Negative for hyperdense vessel Skull: Negative Other: None CT MAXILLOFACIAL FINDINGS Osseous: Negative for facial fracture Orbits: Negative for orbital fracture. Bilateral cataract extraction. No orbital mass or edema. Sinuses: Mild mucosal edema maxillary sinus bilaterally. Remaining sinuses clear. Soft tissues: No soft tissue thickening. CT CERVICAL SPINE FINDINGS Alignment: Normal alignment.  Straightening of the cervical lordosis Skull base and vertebrae: Negative for fracture Soft tissues and spinal canal: Negative Disc levels: Disc degeneration and spurring throughout the cervical spine most prominent C4-5 and C5-6 and C6-7. Facet degeneration in the lower cervical spine. Foraminal stenosis right greater than left C4 through C7. Upper chest: Lung apices clear bilaterally Other: None IMPRESSION: 1. Negative CT head 2. Negative for facial fracture 3. Cervical spondylosis.  Negative for cervical fracture. Electronically Signed   By: Franchot Gallo M.D.   On: 05/10/2021 19:09   CT Cervical Spine Wo Contrast  Result Date: 05/10/2021 CLINICAL DATA:  Fall.  Head trauma EXAM: CT HEAD WITHOUT CONTRAST CT MAXILLOFACIAL WITHOUT CONTRAST CT CERVICAL SPINE WITHOUT CONTRAST TECHNIQUE: Multidetector CT imaging of the head, cervical spine, and maxillofacial structures were performed using the standard protocol without intravenous contrast. Multiplanar CT image reconstructions of the cervical spine and maxillofacial structures were also generated. COMPARISON:  CT head 02/04/2010 FINDINGS: CT HEAD FINDINGS Brain: No evidence of acute infarction, hemorrhage, hydrocephalus, extra-axial collection or mass lesion/mass effect. Vascular: Negative for hyperdense vessel Skull: Negative Other: None CT MAXILLOFACIAL FINDINGS Osseous: Negative for facial fracture Orbits: Negative for orbital fracture. Bilateral cataract extraction. No orbital mass or edema. Sinuses: Mild mucosal edema maxillary sinus bilaterally. Remaining  sinuses clear. Soft tissues: No soft tissue thickening. CT CERVICAL SPINE FINDINGS Alignment: Normal alignment.  Straightening of the cervical lordosis Skull base and vertebrae: Negative for fracture Soft tissues and spinal canal: Negative Disc levels: Disc degeneration and spurring throughout the cervical spine most prominent C4-5 and C5-6 and C6-7. Facet degeneration in the lower cervical spine. Foraminal stenosis right greater than left C4 through C7. Upper chest: Lung apices clear bilaterally Other: None IMPRESSION: 1. Negative CT head 2. Negative for facial fracture 3. Cervical spondylosis.  Negative for cervical fracture. Electronically Signed   By: Franchot Gallo M.D.   On: 05/10/2021 19:09   DG Knee Complete 4 Views Left  Result Date: 05/10/2021 CLINICAL DATA:  Status post fall. EXAM: LEFT KNEE - COMPLETE 4+ VIEW COMPARISON:  None. FINDINGS: No evidence of fracture, dislocation, or joint effusion. No evidence of arthropathy or other focal bone abnormality. Mild soft tissue swelling is seen anterior to the right patella. IMPRESSION: Mild anterior soft tissue swelling without evidence of acute fracture or dislocation. Electronically Signed   By: Virgina Norfolk M.D.   On: 05/10/2021 19:03   CT Maxillofacial Wo Contrast  Result Date: 05/10/2021 CLINICAL DATA:  Fall.  Head trauma EXAM: CT HEAD WITHOUT CONTRAST CT MAXILLOFACIAL WITHOUT CONTRAST CT CERVICAL SPINE WITHOUT CONTRAST TECHNIQUE: Multidetector CT imaging of the head, cervical spine, and maxillofacial structures were performed using the standard protocol without intravenous contrast. Multiplanar CT image reconstructions of the cervical spine and maxillofacial structures were also generated. COMPARISON:  CT head 02/04/2010 FINDINGS: CT HEAD FINDINGS Brain: No evidence of acute infarction, hemorrhage, hydrocephalus, extra-axial collection or mass lesion/mass effect. Vascular: Negative for hyperdense vessel Skull: Negative Other: None CT  MAXILLOFACIAL FINDINGS Osseous: Negative for facial  fracture Orbits: Negative for orbital fracture. Bilateral cataract extraction. No orbital mass or edema. Sinuses: Mild mucosal edema maxillary sinus bilaterally. Remaining sinuses clear. Soft tissues: No soft tissue thickening. CT CERVICAL SPINE FINDINGS Alignment: Normal alignment.  Straightening of the cervical lordosis Skull base and vertebrae: Negative for fracture Soft tissues and spinal canal: Negative Disc levels: Disc degeneration and spurring throughout the cervical spine most prominent C4-5 and C5-6 and C6-7. Facet degeneration in the lower cervical spine. Foraminal stenosis right greater than left C4 through C7. Upper chest: Lung apices clear bilaterally Other: None IMPRESSION: 1. Negative CT head 2. Negative for facial fracture 3. Cervical spondylosis.  Negative for cervical fracture. Electronically Signed   By: Franchot Gallo M.D.   On: 05/10/2021 19:09    Procedures Procedures   Medications Ordered in ED Medications  Tdap (BOOSTRIX) injection 0.5 mL (0.5 mLs Intramuscular Given 05/10/21 1819)    ED Course  I have reviewed the triage vital signs and the nursing notes.  Pertinent labs & imaging results that were available during my care of the patient were reviewed by me and considered in my medical decision making (see chart for details).  Here for evaluation after mechanical fall which occurred earlier today.  Does have abrasion to right forehead however no lacerations to suture.  No evidence of traumatic eye injury.  No midline C/T/L tenderness.  He is ambulatory.  Tenderness to right anterior chest wall however no overlying skin changes, crepitus.  No hypoxia.  Abdomen soft, nontender.  Patient does state that he has had intermittent falls which he feels is due to not picking up his left leg fully.  He has no foot drop on exam.  He is no bowel or bladder incontinence, saddle paresthesia.  He is ambulatory here without difficulty.  He  has no obvious neurologic deficits or weakness on exam.  Unclear etiology of this, however after speaking with patient states has been going on for quite some time.  Low suspicion for acute neurosurgical emergency however did discuss patient following up with his primary care provider for this.  Imaging today personally reviewed and interpreted does not show evidence of acute abnormality.  Discussed deep breaths, lidocaine patches, Tylenol, ibuprofen, ice/heat and follow-up outpatient.  He is agreeable.  The patient has been appropriately medically screened and/or stabilized in the ED. I have low suspicion for any other emergent medical condition which would require further screening, evaluation or treatment in the ED or require inpatient management.  Patient is hemodynamically stable and in no acute distress.  Patient able to ambulate in department prior to ED.  Evaluation does not show acute pathology that would require ongoing or additional emergent interventions while in the emergency department or further inpatient treatment.  I have discussed the diagnosis with the patient and answered all questions.  Pain is been managed while in the emergency department and patient has no further complaints prior to discharge.  Patient is comfortable with plan discussed in room and is stable for discharge at this time.  I have discussed strict return precautions for returning to the emergency department.  Patient was encouraged to follow-up with PCP/specialist refer to at discharge.     MDM Rules/Calculators/A&P                             Final Clinical Impression(s) / ED Diagnoses Final diagnoses:  Fall, initial encounter  Injury of head, initial encounter  Acute pain of  left knee    Rx / DC Orders ED Discharge Orders          Ordered    lidocaine (LIDODERM) 5 %  Every 24 hours        05/10/21 1933             Sevana Grandinetti A, PA-C 05/10/21 1940    Truddie Hidden, MD 05/10/21  2202

## 2021-05-10 NOTE — ED Notes (Signed)
Pt able to ambulate around unit with independent steady gait, states he feels like he is walking fine. PA informed.

## 2021-05-25 DIAGNOSIS — R413 Other amnesia: Secondary | ICD-10-CM | POA: Diagnosis not present

## 2021-05-25 DIAGNOSIS — W19XXXA Unspecified fall, initial encounter: Secondary | ICD-10-CM | POA: Diagnosis not present

## 2021-06-20 DIAGNOSIS — Z7984 Long term (current) use of oral hypoglycemic drugs: Secondary | ICD-10-CM | POA: Diagnosis not present

## 2021-06-20 DIAGNOSIS — E11649 Type 2 diabetes mellitus with hypoglycemia without coma: Secondary | ICD-10-CM | POA: Diagnosis not present

## 2021-06-20 DIAGNOSIS — M549 Dorsalgia, unspecified: Secondary | ICD-10-CM | POA: Diagnosis not present

## 2021-06-27 DIAGNOSIS — Z7984 Long term (current) use of oral hypoglycemic drugs: Secondary | ICD-10-CM | POA: Diagnosis not present

## 2021-06-27 DIAGNOSIS — M549 Dorsalgia, unspecified: Secondary | ICD-10-CM | POA: Diagnosis not present

## 2021-06-27 DIAGNOSIS — E1165 Type 2 diabetes mellitus with hyperglycemia: Secondary | ICD-10-CM | POA: Diagnosis not present

## 2021-06-27 DIAGNOSIS — E11649 Type 2 diabetes mellitus with hypoglycemia without coma: Secondary | ICD-10-CM | POA: Diagnosis not present

## 2021-06-29 DIAGNOSIS — E1142 Type 2 diabetes mellitus with diabetic polyneuropathy: Secondary | ICD-10-CM | POA: Diagnosis not present

## 2021-06-29 DIAGNOSIS — N1831 Chronic kidney disease, stage 3a: Secondary | ICD-10-CM | POA: Diagnosis not present

## 2021-06-29 DIAGNOSIS — E162 Hypoglycemia, unspecified: Secondary | ICD-10-CM | POA: Diagnosis not present

## 2021-08-16 ENCOUNTER — Other Ambulatory Visit: Payer: Self-pay | Admitting: Internal Medicine

## 2021-08-16 ENCOUNTER — Ambulatory Visit
Admission: RE | Admit: 2021-08-16 | Discharge: 2021-08-16 | Disposition: A | Discharge status: Home / Self Care | Payer: Medicare Other | Source: Ambulatory Visit | Attending: Internal Medicine | Admitting: Internal Medicine

## 2021-08-16 DIAGNOSIS — M545 Low back pain, unspecified: Secondary | ICD-10-CM

## 2021-08-16 DIAGNOSIS — M542 Cervicalgia: Secondary | ICD-10-CM | POA: Diagnosis not present

## 2021-08-16 DIAGNOSIS — Z125 Encounter for screening for malignant neoplasm of prostate: Secondary | ICD-10-CM | POA: Diagnosis not present

## 2021-08-16 DIAGNOSIS — M549 Dorsalgia, unspecified: Secondary | ICD-10-CM | POA: Diagnosis not present

## 2021-08-16 DIAGNOSIS — M47816 Spondylosis without myelopathy or radiculopathy, lumbar region: Secondary | ICD-10-CM | POA: Diagnosis not present

## 2021-08-16 DIAGNOSIS — N1831 Chronic kidney disease, stage 3a: Secondary | ICD-10-CM | POA: Diagnosis not present

## 2021-08-16 DIAGNOSIS — G8929 Other chronic pain: Secondary | ICD-10-CM | POA: Diagnosis not present

## 2021-08-16 DIAGNOSIS — M48061 Spinal stenosis, lumbar region without neurogenic claudication: Secondary | ICD-10-CM | POA: Diagnosis not present

## 2021-08-18 ENCOUNTER — Encounter: Payer: Self-pay | Admitting: Psychology

## 2021-08-18 DIAGNOSIS — F411 Generalized anxiety disorder: Secondary | ICD-10-CM | POA: Insufficient documentation

## 2021-08-18 DIAGNOSIS — N1831 Chronic kidney disease, stage 3a: Secondary | ICD-10-CM | POA: Insufficient documentation

## 2021-08-18 DIAGNOSIS — C61 Malignant neoplasm of prostate: Secondary | ICD-10-CM | POA: Insufficient documentation

## 2021-08-18 DIAGNOSIS — K635 Polyp of colon: Secondary | ICD-10-CM | POA: Insufficient documentation

## 2021-08-18 DIAGNOSIS — E11649 Type 2 diabetes mellitus with hypoglycemia without coma: Secondary | ICD-10-CM | POA: Insufficient documentation

## 2021-08-18 DIAGNOSIS — E559 Vitamin D deficiency, unspecified: Secondary | ICD-10-CM | POA: Insufficient documentation

## 2021-08-18 DIAGNOSIS — I8393 Asymptomatic varicose veins of bilateral lower extremities: Secondary | ICD-10-CM | POA: Insufficient documentation

## 2021-08-18 DIAGNOSIS — M545 Low back pain, unspecified: Secondary | ICD-10-CM | POA: Insufficient documentation

## 2021-08-18 DIAGNOSIS — F329 Major depressive disorder, single episode, unspecified: Secondary | ICD-10-CM | POA: Insufficient documentation

## 2021-08-18 DIAGNOSIS — T753XXA Motion sickness, initial encounter: Secondary | ICD-10-CM | POA: Insufficient documentation

## 2021-08-18 DIAGNOSIS — G8929 Other chronic pain: Secondary | ICD-10-CM | POA: Insufficient documentation

## 2021-08-18 DIAGNOSIS — E782 Mixed hyperlipidemia: Secondary | ICD-10-CM | POA: Insufficient documentation

## 2021-08-18 DIAGNOSIS — N529 Male erectile dysfunction, unspecified: Secondary | ICD-10-CM | POA: Insufficient documentation

## 2021-08-18 DIAGNOSIS — I82409 Acute embolism and thrombosis of unspecified deep veins of unspecified lower extremity: Secondary | ICD-10-CM | POA: Insufficient documentation

## 2021-08-18 DIAGNOSIS — I639 Cerebral infarction, unspecified: Secondary | ICD-10-CM | POA: Insufficient documentation

## 2021-08-18 DIAGNOSIS — K429 Umbilical hernia without obstruction or gangrene: Secondary | ICD-10-CM | POA: Insufficient documentation

## 2021-08-18 DIAGNOSIS — E1142 Type 2 diabetes mellitus with diabetic polyneuropathy: Secondary | ICD-10-CM | POA: Insufficient documentation

## 2021-08-18 DIAGNOSIS — G4700 Insomnia, unspecified: Secondary | ICD-10-CM | POA: Insufficient documentation

## 2021-08-18 DIAGNOSIS — M5430 Sciatica, unspecified side: Secondary | ICD-10-CM | POA: Insufficient documentation

## 2021-08-18 DIAGNOSIS — I10 Essential (primary) hypertension: Secondary | ICD-10-CM | POA: Insufficient documentation

## 2021-08-18 DIAGNOSIS — E119 Type 2 diabetes mellitus without complications: Secondary | ICD-10-CM | POA: Insufficient documentation

## 2021-08-18 DIAGNOSIS — J029 Acute pharyngitis, unspecified: Secondary | ICD-10-CM | POA: Insufficient documentation

## 2021-08-18 DIAGNOSIS — R319 Hematuria, unspecified: Secondary | ICD-10-CM | POA: Insufficient documentation

## 2021-08-18 NOTE — Progress Notes (Signed)
? ?NEUROPSYCHOLOGICAL EVALUATION ?Richard Torres. [facility] ?[place] Department of Neurology ? ?Date of Evaluation: August 19, 2021 ? ?Reason for Referral:  ? ?Richard Torres is a 76 y.o. right-handed Caucasian male referred by  Josetta Huddle, M.D. , to characterize his current cognitive functioning and assist with diagnostic clarity and treatment planning in the context of subjective cognitive decline.  ? ?Assessment and Plan:  ? ?Clinical Impression(s): ?Richard Torres's pattern of performance is suggestive of significant impairment surrounding most aspects of verbal learning and memory. Some variability was seen across retention percentages; however, normative performances were exceptionally low across encoding (i.e., learning), retrieval, and recognition/consolidation aspects of verbal memory. Performance was appropriate across all other assessed cognitive domains relative to age-matched peers. This includes processing speed, attention/concentration, executive functioning, safety/judgment, receptive and expressive language, visuospatial abilities, and visual learning and memory. Richard Torres denied difficulties completing instrumental activities of daily living (ADLs) independently. As such, given evidence for cognitive dysfunction described above, he meets criteria for a Mild Neurocognitive Disorder ("mild cognitive impairment") at the present time. ? ?The etiology of current verbal memory impairment is unclear at the present time. I cannot rule out the presence of Alzheimer's disease in very early stages. While he was not fully amnestic across verbal memory measures, he did exhibit impairments learning information, retrieving learned information after a delay, and recognizing information when it was re-presented to him in a yes/no format. This would suggest an evolving memory storage deficit, which is concerning for this illness. However, at the present time, visual learning and memory was  intact, as were other non-memory abilities commonly affected early on in this disease process. As this illness remains a possibility, continued monitoring will be important, especially regarding any evidence for further cognitive or functional decline as time progresses.  ? ?It is worth highlighting that neuroimaging in 2011 did suggest a "tiny" infarct involving the left medial temporal lobe. Damage to the left medial temporal lobe can negatively influence verbal memory processes assuming normal brain lateralization in this right-handed individual. However, I would be surprised if an infarct of its size would be responsible for fairly significant verbal memory impairment across testing. It is also important to point out that Richard Torres and his wife denied any observed memory deficits after this event, with their proposed timeline of decline being within the past few years rather than the past 12 years. As such, this as the primary cause for memory dysfunction would seem less likely.  ? ?He does not demonstrate cognitive or behavioral patterns concerning for Lewy body disease, frontotemporal lobar degeneration, or other more rare parkinsonian conditions. While mild sleep dysregulation, various psychosocial stressors, and acute pain symptoms stemming from his December 6294 fall can certainly impact day-to-day functioning, current memory scores are more severe than what would be expected from these contributions alone. He did not report significant psychiatric distress across related questionnaires or during interview.  ? ?Recommendations: ?A repeat neuropsychological evaluation in 18-24 months (or sooner if functional decline is noted) is recommended to assess the trajectory of future cognitive decline should it occur. This will also aid in future efforts towards improved diagnostic clarity. ? ?I do not believe that he is currently followed by neurology. If desired, I will place a referral for him. The primary  benefit of this referral, given his relative lack of other neurological symptoms, would be to discuss the pros and cons of memory-based medications. It is important to highlight that this medication has been shown to slow functional  decline in some individuals. There is no current treatment which can stop or reverse cognitive decline when caused by a neurodegenerative illness.  ? ?Updated neuroimaging in the form of a brain MRI could additionally be useful given that his last scan (based upon what was available for me to review) was performed in 2011.  ? ?Richard Torres is encouraged to attend to lifestyle factors for brain health (e.g., regular physical exercise, good nutrition habits, regular participation in cognitively-stimulating activities, and general stress management techniques), which are likely to have benefits for both emotional adjustment and cognition. Optimal control of vascular risk factors (including safe cardiovascular exercise and adherence to dietary recommendations) is encouraged. Continued participation in activities which provide mental stimulation and social interaction is also recommended.  ? ?Important information should be provided to Richard Torres in written format in all instances. This information should be placed in a highly frequented and easily visible location within his home to promote recall. External strategies such as written notes in a consistently used memory journal, visual and nonverbal auditory cues such as a calendar on the refrigerator or appointments with alarm, such as on a cell phone, can also help maximize recall. ? ?Memory can be also improved using internal strategies such as rehearsal, repetition, chunking, mnemonics, association, and imagery. ? ?To address problems with fluctuating attention, he may wish to consider: ?  -Avoiding external distractions when needing to concentrate ?  -Limiting exposure to fast paced environments with multiple sensory demands ?   -Writing down complicated information and using checklists ?  -Attempting and completing one task at a time (i.e., no multi-tasking) ?  -Verbalizing aloud each step of a task to maintain focus ?  -Reducing the amount of information considered at one time ? ?Review of Records:  ? ?Mr. Goldbach contacted his PCP Josetta Huddle, M.D.) on 11/08/2020 expressing concerns surrounding short-term memory dysfunction. Dr. Inda Merlin noted that he had also spoken with Mr. Quaintance's wife while they were last in the office and she also expressed similar concerns. Dr. Inda Merlin questioned if some memory concerns were related to underlying depressive symptoms. Mr. Brendle was most recently seen by his PCP on 03/17/2021. Performance on a brief cognitive screening instrument (MMSE) was reportedly 30/30. Ultimately, Mr. Parrilla was referred for a comprehensive neuropsychological evaluation to characterize his cognitive abilities and to assist with diagnostic clarity and treatment planning.  ? ?Brain MRI on 02/05/2010 revealed a "tiny" acute infarct located in the left anterior medial temporal lobe, minimal microvascular ischemic changes, and very mild atrophy. Head CT on 05/10/2021 in the context of a fall was negative. No other neuroimaging was available for review.  ? ?Past Medical History:  ?Diagnosis Date  ? Acute embolism and thrombosis of unspecified deep veins of unspecified lower extremity   ? Asymptomatic varicose veins of bilateral lower extremities   ? Chronic kidney disease, stage 3a   ? Chronic pain   ? Clotting disorder   ? hx clot in left leg/right leg  ? DJD (degenerative joint disease)   ? ED (erectile dysfunction) of organic origin   ? Elevated liver enzymes 07/21/2011  ? Environmental allergies   ? Essential hypertension   ? Generalized anxiety disorder   ? Headache 07/21/2011  ? Hematuria syndrome   ? Herniated lumbar disc without myelopathy 03/08/2020  ? Hypoglycemia due to type 2 diabetes mellitus   ? Incisional  hernia, without obstruction or gangrene 02/10/2013  ? Insomnia   ? Low back pain   ? Major depressive  disorder   ? Malignant tumor of prostate   ? Mixed hyperlipidemia   ? Motion sickness   ? Normocytic anemia 03/01/201

## 2021-08-19 ENCOUNTER — Ambulatory Visit: Payer: Medicare Other

## 2021-08-19 ENCOUNTER — Ambulatory Visit (INDEPENDENT_AMBULATORY_CARE_PROVIDER_SITE_OTHER): Payer: Medicare Other | Admitting: Psychology

## 2021-08-19 ENCOUNTER — Encounter: Payer: Self-pay | Admitting: Psychology

## 2021-08-19 DIAGNOSIS — I639 Cerebral infarction, unspecified: Secondary | ICD-10-CM

## 2021-08-19 DIAGNOSIS — R4189 Other symptoms and signs involving cognitive functions and awareness: Secondary | ICD-10-CM

## 2021-08-19 DIAGNOSIS — G3184 Mild cognitive impairment, so stated: Secondary | ICD-10-CM

## 2021-08-19 HISTORY — DX: Mild cognitive impairment of uncertain or unknown etiology: G31.84

## 2021-08-19 NOTE — Progress Notes (Signed)
? ?  Psychometrician Note ?  ?Cognitive testing was administered to Richard Torres by Richard Torres, B.S. (psychometrist) under the supervision of Dr. Christia Reading, Ph.D., licensed psychologist on 08/19/2021. Richard Torres did not appear overtly distressed by the testing session per behavioral observation or responses across self-report questionnaires. Rest breaks were offered.  ?  ?The battery of tests administered was selected by Dr. Christia Reading, Ph.D. with consideration to Richard Torres's current level of functioning, the nature of his symptoms, emotional and behavioral responses during interview, level of literacy, observed level of motivation/effort, and the nature of the referral question. This battery was communicated to the psychometrist. Communication between Dr. Christia Reading, Ph.D. and the psychometrist was ongoing throughout the evaluation and Dr. Christia Reading, Ph.D. was immediately accessible at all times. Dr. Christia Reading, Ph.D. provided supervision to the psychometrist on the date of this service to the extent necessary to assure the quality of all services provided.  ?  ?Richard Torres will return within approximately 1-2 weeks for an interactive feedback session with Richard Torres at which time his test performances, clinical impressions, and treatment recommendations will be reviewed in detail. Richard Torres understands he can contact our office should he require our assistance before this time. ? ?A total of 135 minutes of billable time were spent face-to-face with Richard Torres by the psychometrist. This includes both test administration and scoring time. Billing for these services is reflected in the clinical report generated by Dr. Christia Reading, Ph.D. ? ?This note reflects time spent with the psychometrician and does not include test scores or any clinical interpretations made by Richard Torres. The full report will follow in a separate note.  ?

## 2021-08-25 ENCOUNTER — Ambulatory Visit (INDEPENDENT_AMBULATORY_CARE_PROVIDER_SITE_OTHER): Payer: Medicare Other | Admitting: Psychology

## 2021-08-25 DIAGNOSIS — G3184 Mild cognitive impairment, so stated: Secondary | ICD-10-CM

## 2021-08-25 DIAGNOSIS — I639 Cerebral infarction, unspecified: Secondary | ICD-10-CM | POA: Diagnosis not present

## 2021-08-25 NOTE — Progress Notes (Signed)
? ?  Neuropsychology Feedback Session ?[facility] ?[place] Department of Neurology ? ?Reason for Referral:  ? ?Richard Torres is a 76 y.o. right-handed Caucasian male referred by  Josetta Huddle, M.D. , to characterize his current cognitive functioning and assist with diagnostic clarity and treatment planning in the context of subjective cognitive decline.  ? ?Feedback:  ? ?[name] completed a comprehensive neuropsychological evaluation on 08/19/2021. Please refer to that encounter for the full report and recommendations. Briefly, results suggested significant impairment surrounding most aspects of verbal learning and memory. Some variability was seen across retention percentages; however, normative performances were exceptionally low across encoding (i.e., learning), retrieval, and recognition/consolidation aspects of verbal memory. Performance was appropriate across all other assessed cognitive domains relative to age-matched peers. The etiology of current verbal memory impairment is unclear at the present time. I cannot rule out the presence of Alzheimer's disease in very early stages. While he was not fully amnestic across verbal memory measures, he did exhibit impairments learning information, retrieving learned information after a delay, and recognizing information when it was re-presented to him in a yes/no format. This would suggest an evolving memory storage deficit, which is concerning for this illness. However, at the present time, visual learning and memory was intact, as were other non-memory abilities commonly affected early on in this disease process. As this illness remains a possibility, continued monitoring will be important, especially regarding any evidence for further cognitive or functional decline as time progresses.  ? ?[name] was accompanied by his wife during the current feedback session. Content of the current session focused on the results of his  neuropsychological evaluation. [name] was given the opportunity to ask questions and his questions were answered. He was encouraged to reach out should additional questions arise. A copy of his report was provided at the conclusion of the visit.  ? ?  ? ?40 minutes were spent conducting the current feedback session with [name], billed as one unit [id number].  ?

## 2021-09-15 ENCOUNTER — Ambulatory Visit: Payer: Medicare Other | Attending: Internal Medicine

## 2021-09-15 DIAGNOSIS — M6281 Muscle weakness (generalized): Secondary | ICD-10-CM | POA: Diagnosis not present

## 2021-09-15 DIAGNOSIS — M545 Low back pain, unspecified: Secondary | ICD-10-CM | POA: Diagnosis not present

## 2021-09-15 DIAGNOSIS — G8929 Other chronic pain: Secondary | ICD-10-CM | POA: Diagnosis not present

## 2021-09-15 DIAGNOSIS — R252 Cramp and spasm: Secondary | ICD-10-CM | POA: Insufficient documentation

## 2021-09-15 NOTE — Therapy (Signed)
?OUTPATIENT PHYSICAL THERAPY Thoracolumbar EVALUATION ? ? ?Patient Name: Richard Torres ?MRN: [id number] ?DOB:1945-06-04, 76 y.o., male ?Today's Date: 09/16/2021 ? ? PT End of Session - 09/16/21 0553   ? ? Visit Number 1   ? Number of Visits 13   ? Date for PT Re-Evaluation 11/04/21   ? Authorization Type MEDICARE PART A AND B   ? Authorization Time Period [employer]   ? Progress Note Due on Visit 10   ? PT Start Time 0848   ? PT Stop Time 9563   ? PT Time Calculation (min) 47 min   ? Activity Tolerance Patient tolerated treatment well   ? Behavior During Therapy [facility] for tasks assessed/performed   ? ?  ?  ? ?  ? ? ?Past Medical History:  ?Diagnosis Date  ? Acute embolism and thrombosis of unspecified deep veins of unspecified lower extremity   ? Asymptomatic varicose veins of bilateral lower extremities   ? Chronic kidney disease, stage 3a   ? Chronic pain   ? Clotting disorder   ? hx clot in left leg/right leg  ? DJD (degenerative joint disease)   ? ED (erectile dysfunction) of organic origin   ? Elevated liver enzymes 07/21/2011  ? Environmental allergies   ? Essential hypertension   ? Generalized anxiety disorder   ? Headache 07/21/2011  ? Hematuria syndrome   ? Herniated lumbar disc without myelopathy 03/08/2020  ? Hypoglycemia due to type 2 diabetes mellitus   ? Incisional hernia, without obstruction or gangrene 02/10/2013  ? Insomnia   ? Low back pain   ? Major depressive disorder   ? Malignant tumor of prostate   ? Mild cognitive impairment with memory loss 08/19/2021  ? Mixed hyperlipidemia   ? Motion sickness   ? Normocytic anemia 07/21/2011  ? Pharyngitis   ? Polyneuropathy due to type 2 diabetes mellitus   ? Polyp of colon   ? Renal insufficiency 07/21/2011  ? Sciatica   ? Splenomegaly 07/21/2011  ? Stroke   ? 2011 MRI - Tiny acute infarct anterior medial left temporal lobe  ? Type II diabetes mellitus   ? Umbilical hernia   ? Vitamin D deficiency   ? ?Past Surgical History:  ?Procedure  Laterality Date  ? bilateral inguinal heria repairs    ? cholecystectomy    ? CHOLECYSTECTOMY    ? COLONOSCOPY    ? EYE SURGERY Bilateral   ? cataracts removed  ? HERNIA REPAIR    ? LUMBAR LAMINECTOMY/ DECOMPRESSION WITH MET-RX Right 03/08/2020  ? Procedure: Right Lumbar Five Sacral OneExtraforaminal microdiscectomy;  Surgeon: Erline Levine, MD;  Location: [facility];  Service: Neurosurgery;  Laterality: Right;  ? PROSTATECTOMY    ? VARICOSE VEIN SURGERY Bilateral   ? laser - legs  ? ?Patient Active Problem List  ? Diagnosis Date Noted  ? Mild cognitive impairment with memory loss 08/19/2021  ? Acute embolism and thrombosis of unspecified deep veins of unspecified lower extremity   ? Generalized anxiety disorder   ? Chronic kidney disease, stage 3a   ? Chronic pain   ? ED (erectile dysfunction) of organic origin   ? Stroke   ? Essential hypertension   ? Hematuria syndrome   ? Mixed hyperlipidemia   ? Type II diabetes mellitus   ? Hypoglycemia due to type 2 diabetes mellitus   ? Insomnia   ? Low back pain   ? Major depressive disorder   ? Malignant tumor  of prostate ([place])   ? Motion sickness   ? Pharyngitis   ? Polyneuropathy due to type 2 diabetes mellitus   ? Polyp of colon   ? Sciatica   ? Umbilical hernia   ? Asymptomatic varicose veins of bilateral lower extremities   ? Vitamin D deficiency   ? Herniated lumbar disc without myelopathy 03/08/2020  ? Incisional hernia, without obstruction or gangrene 02/10/2013  ? Headache 07/21/2011  ? Splenomegaly 07/21/2011  ? Elevated liver enzymes 07/21/2011  ? Normocytic anemia 07/21/2011  ? Renal insufficiency 07/21/2011  ? ? ?PCP: Kathalene Frames, MD ? ?REFERRING PROVIDER: Kathalene Frames, MD ? ?REFERRING DIAG: ?Low back pain, unspecified ?Cervicalgia ? ? ?THERAPY DIAG:  ?Chronic midline low back pain without sciatica ? ?Cramp and spasm ? ?Muscle weakness (generalized) ? ?ONSET DATE: 6  months, but a Hx chronic back pain in the past ? ?SUBJECTIVE:  ? ?SUBJECTIVE  STATEMENT: ?No MOI. Most significant pain with sleeping and waking up in the AM. Walks on a TM with incline in the evenings to control his blood sugar. Pt notes his low back bothers him more than his neck. ? ?PERTINENT HISTORY: ?DJD, DM2, LUMBAR LAMINECTOMY/ DECOMPRESSION 03/08/2020 ? ?PAIN:  ?Are you having pain? Yes: NPRS scale: 7/10 ?Pain location: low back ?Pain description: ache, stiffness ?Aggravating factors: AM,  ?Relieving factors: Waking up and moving ?7/10when he wakes up in the AM 0-3//10 during the day. ? ?PRECAUTIONS: None ? ?WEIGHT BEARING RESTRICTIONS No ? ?FALLS:  ?Has patient fallen in last 6 months? Yes, catching his L toe ? ?LIVING ENVIRONMENT: ?Lives with: lives with their family ?No issue with accessing or mobility within home ? 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 ?PLOF: Independent ? ?PATIENT GOALS Less pain ? ? ?OBJECTIVE:  ? ?DIAGNOSTIC FINDINGS:  ?Lumbar Xray 08/17/21 ?IMPRESSION: ?No recent fracture is seen in the lumbar spine. Lumbar spondylosis ?with bony spurs, disc space narrowing, facet hypertrophy, spinal ?stenosis and encroachment of neural foramina, especially severe from ?L3-S1 levels. ? ? Cervical CT 05/10/21 ?IMPRESSION: ?1. Negative CT head ?2. Negative for facial fracture ?3. Cervical spondylosis.  Negative for cervical fracture. ? ?PATIENT SURVEYS:  ?FOTO TBA ? ?COGNITION: ? Overall cognitive status: Within functional limits for tasks assessed   ?  ?SENSATION: ?WFL ? ?MUSCLE LENGTH: ?Hamstrings: Right 38 deg; Left 36 deg ?Thomas test: Right TBA deg; Left TBA deg ? ?POSTURE:  ?Decreased lordosis ? ?PALPATION: ?Not TTP of the lumbar paraspinal  ? ?LUMBAR ROM:  ? ?Active  A/PROM  ?09/16/2021  ?Flexion Markedly limited, stiff  ?Extension Min limited , pressure  ?Right lateral flexion Mod limited, stiff  ?Left lateral flexion Mod limited stiff  ?Right rotation Mod limited, stiff  ?Left rotation Mod limited stiff  ? (Blank rows = not tested) ? ?LUMBAR  SPECIAL TESTS:  ?Straight leg raise test: Negative, Slump test: Negative, and SI Compression/distraction test: Positive for compression, neg for distraction ? ?LE ROM: ?  Grossly WNLs ? ?LE MMT: ?  4+ to 5/5 ecept for hip abd and ext at 4-/5 ? ?FUNCTIONAL TESTS:  ?None- no significant functional limitations observed ? ?GAIT: ?Distance walked: 200' in th clinic ?Assistive device utilized: None ?Level of assistance: Complete Independence ?Comments: WNLs ? ?TODAY'S TREATMENT: ?- Seated Hamstring Stretch  3 reps - 30 hold ?- Seated Flexion Stretch with Swiss Ball  - 2 reps each - 20 hold forforward and laterally ? ?PATIENT EDUCATION:  ?Education details: Eval findings, POC, HEP, sleeping  positions and support for comfort ?Person educated: Patient ?Education method: Explanation, Demonstration, Tactile cues, Verbal cues, and Handouts ?Education comprehension: verbalized understanding, returned demonstration, verbal cues required, and tactile cues required ? ? ?HOME EXERCISE PROGRAM: ?Access Code: [email or web address] ?URL: [email or web address] ?Date: 09/16/2021 ?Prepared by: Gar Ponto ? ?Exercises ?- Seated Hamstring Stretch  - 2 x daily - 7 x weekly - 1 sets - 3 reps - 30 hold ?- Seated Flexion Stretch with Swiss Ball  - 2 x daily - 7 x weekly - 1 sets - 6 reps - 20 hold ? ?ASSESSMENT: ? ?CLINICAL IMPRESSION: ?Patient is a 76 y.o. M who was seen today for physical therapy evaluation and treatment for Low back pain, unspecified; Cervicalgia. Today's session was complete for evaluation and treatment LBP with he pt reporting his low back giving him the most difficulty. ? ? ?OBJECTIVE IMPAIRMENTS decreased balance, decreased knowledge of condition, decreased ROM, decreased strength, impaired flexibility, and pain.  ? ?ACTIVITY LIMITATIONS driving, occupation, yard work, shopping, and sleep .  ? ?PERSONAL FACTORS Time since onset of injury/illness/exacerbation and 1-2 comorbidities: DJD, Hx of low abck surgery  are also  affecting patient's functional outcome.  ? ? ?REHAB POTENTIAL: Good ? ?CLINICAL DECISION MAKING: Stable/uncomplicated ? ?EVALUATION COMPLEXITY: Low ? ? ?GOALS: ? ?SHORT TERM GOALS: [place] date: 10/07/2021 ?

## 2021-09-25 NOTE — Therapy (Signed)
?OUTPATIENT PHYSICAL THERAPY TREATMENT NOTE ? ? ?Patient Name: Richard Torres ?MRN: [id number] ?DOB:[birth year], 76 y.o., male ?Today's Date: 09/26/2021 ? ? ?END OF SESSION:  ? PT End of Session - 09/26/21 9373   ? ? Visit Number 2   ? Number of Visits 13   ? Date for PT Re-Evaluation 11/04/21   ? Authorization Type MEDICARE PART A AND B   ? Authorization Time Period [employer]   ? PT Start Time 0800   ? PT Stop Time 0845   ? PT Time Calculation (min) 45 min   ? Activity Tolerance Patient tolerated treatment well   ? Behavior During Therapy [facility] for tasks assessed/performed   ? ?  ?  ? ?  ? ? ?Past Medical History:  ?Diagnosis Date  ? Acute embolism and thrombosis of unspecified deep veins of unspecified lower extremity   ? Asymptomatic varicose veins of bilateral lower extremities   ? Chronic kidney disease, stage 3a   ? Chronic pain   ? Clotting disorder   ? hx clot in left leg/right leg  ? DJD (degenerative joint disease)   ? ED (erectile dysfunction) of organic origin   ? Elevated liver enzymes 07/21/2011  ? Environmental allergies   ? Essential hypertension   ? Generalized anxiety disorder   ? Headache 07/21/2011  ? Hematuria syndrome   ? Herniated lumbar disc without myelopathy 03/08/2020  ? Hypoglycemia due to type 2 diabetes mellitus   ? Incisional hernia, without obstruction or gangrene 02/10/2013  ? Insomnia   ? Low back pain   ? Major depressive disorder   ? Malignant tumor of prostate   ? Mild cognitive impairment with memory loss 08/19/2021  ? Mixed hyperlipidemia   ? Motion sickness   ? Normocytic anemia 07/21/2011  ? Pharyngitis   ? Polyneuropathy due to type 2 diabetes mellitus   ? Polyp of colon   ? Renal insufficiency 07/21/2011  ? Sciatica   ? Splenomegaly 07/21/2011  ? Stroke   ? 2011 MRI - Tiny acute infarct anterior medial left temporal lobe  ? Type II diabetes mellitus   ? Umbilical hernia   ? Vitamin D deficiency   ? ?Past Surgical History:  ?Procedure Laterality Date  ?  bilateral inguinal heria repairs    ? cholecystectomy    ? CHOLECYSTECTOMY    ? COLONOSCOPY    ? EYE SURGERY Bilateral   ? cataracts removed  ? HERNIA REPAIR    ? LUMBAR LAMINECTOMY/ DECOMPRESSION WITH MET-RX Right 03/08/2020  ? Procedure: Right Lumbar Five Sacral OneExtraforaminal microdiscectomy;  Surgeon: Erline Levine, MD;  Location: [facility];  Service: Neurosurgery;  Laterality: Right;  ? PROSTATECTOMY    ? VARICOSE VEIN SURGERY Bilateral   ? laser - legs  ? ?Patient Active Problem List  ? Diagnosis Date Noted  ? Mild cognitive impairment with memory loss 08/19/2021  ? Acute embolism and thrombosis of unspecified deep veins of unspecified lower extremity   ? Generalized anxiety disorder   ? Chronic kidney disease, stage 3a   ? Chronic pain   ? ED (erectile dysfunction) of organic origin   ? Stroke   ? Essential hypertension   ? Hematuria syndrome   ? Mixed hyperlipidemia   ? Type II diabetes mellitus   ? Hypoglycemia due to type 2 diabetes mellitus   ? Insomnia   ? Low back pain   ? Major depressive disorder   ? Malignant tumor of prostate ([place])   ?  Motion sickness   ? Pharyngitis   ? Polyneuropathy due to type 2 diabetes mellitus   ? Polyp of colon   ? Sciatica   ? Umbilical hernia   ? Asymptomatic varicose veins of bilateral lower extremities   ? Vitamin D deficiency   ? Herniated lumbar disc without myelopathy 03/08/2020  ? Incisional hernia, without obstruction or gangrene 02/10/2013  ? Headache 07/21/2011  ? Splenomegaly 07/21/2011  ? Elevated liver enzymes 07/21/2011  ? Normocytic anemia 07/21/2011  ? Renal insufficiency 07/21/2011  ? ? ? ?REFERRING PROVIDER: Kathalene Frames, MD  ? ?REFERRING DIAG: Diagnosis M54.2 (ICD-10-CM) - Cervicalgia M54.50 (ICD-10-CM) - Low back pain, unspecified  ? ?THERAPY DIAG:  ?Chronic midline low back pain without sciatica ? ?Cramp and spasm ? ?Muscle weakness (generalized) ? ?PERTINENT HISTORY: cognitive impairments, memory and learning ? ?PRECAUTIONS: cog. ? ?SUBJECTIVE:  I did the stretches, has some pain but it never interferes too much.   ? ?PAIN:  ?Are you having pain? Yes: NPRS scale: 5/10 ?Pain location: low back  ?Pain description: achy ,sore  ?Aggravating factors: sitting, AM  ?Relieving factors: stretching  ? ?Are you having pain? Yes: NPRS scale: 5/10 ?Pain location: post cervical, skull and along each side  ?Pain description: tightness , achy ?Aggravating factors: AM  ?Relieving factors: as the day goes on it gets better  ? ? ? ? ?OBJECTIVE: (objective measures completed at initial evaluation unless otherwise dated) ?  ?OBJECTIVE:  ?  ?DIAGNOSTIC FINDINGS:  ?Lumbar Xray 08/17/21 ?IMPRESSION: ?No recent fracture is seen in the lumbar spine. Lumbar spondylosis ?with bony spurs, disc space narrowing, facet hypertrophy, spinal ?stenosis and encroachment of neural foramina, especially severe from ?L3-S1 levels. ?  ? Cervical CT 05/10/21 ?IMPRESSION: ?1. Negative CT head ?2. Negative for facial fracture ?3. Cervical spondylosis.  Negative for cervical fracture. ?  ?PATIENT SURVEYS:  ?FOTO done 09/26/21: 53%, goal 72% ?  ?COGNITION: ?          Overall cognitive status: Within functional limits for tasks assessed               ?           ?SENSATION: ?WFL ?  ?MUSCLE LENGTH: ?Hamstrings: Right 38 deg; Left 36 deg ?Thomas test: Right TBA deg; Left TBA deg ?  ?POSTURE:  ?Decreased lordosis ?  ?PALPATION: ?Not TTP of the lumbar paraspinal  ?  ?LUMBAR ROM:  ?  ?Active  A/PROM  ?09/16/2021  ?Flexion Markedly limited, stiff  ?Extension Min limited , pressure  ?Right lateral flexion Mod limited, stiff  ?Left lateral flexion Mod limited stiff  ?Right rotation Mod limited, stiff  ?Left rotation Mod limited stiff  ? (Blank rows = not tested) ?  ?LUMBAR SPECIAL TESTS:  ?Straight leg raise test: Negative, Slump test: Negative, and SI Compression/distraction test: Positive for compression, neg for distraction ? ?Cervical AROM: ?Flexion 55 ?Extension 46  ?Rot Rt 37 ?Rot L 35 ?Rt lateral flexion  20 ?Lt lateral flexion  15 ? ? ?LE ROM: ?                      Grossly WNLs ?  ?LE MMT: ?                      4+ to 5/5 ecept for hip abd and ext at 4-/5 ?  ?FUNCTIONAL TESTS:  ?None- no significant functional limitations observed ?  ?GAIT: ?Distance walked: 200' in th clinic ?Assistive  device utilized: None ?Level of assistance: Complete Independence ?Comments: WNLs ?  ?TODAY'S TREATMENT: ?- Seated Hamstring Stretch  3 reps - 30 hold ?- Seated Flexion Stretch with Swiss Ball  - 2 reps each - 20 hold forforward and laterally ?  ? ? ?[facility] Adult PT Treatment:                                                DATE: 09/26/21 ?Therapeutic Exercise: ?Nustep L 6 UE and LE for 5 min  warm up and subjective  ?Supine LTR with head turns x 10  ?Supine hamstring stretch/strap 30 sec x 3  ?Knee to chest with rotation  ?Bridging articulating x 10 cues needed  ?Quadruped cat and camel x 10  ?Ardine Eng pose x 3  ?Bird dog for core UE x 5 each then LE x 5 each then combo x 5 each  ? ?Self Care: ?FOTO score   ? ?PATIENT EDUCATION:  ?Education details: Eval findings, POC, HEP, sleeping positions and support for comfort ?Person educated: Patient ?Education method: Explanation, Demonstration, Tactile cues, Verbal cues, and Handouts ?Education comprehension: verbalized understanding, returned demonstration, verbal cues required, and tactile cues required ?  ?  ?HOME EXERCISE PROGRAM: ?Access Code: [email or web address] ?URL: [email or web address] ?Date: 09/16/2021 ?Prepared by: Gar Ponto ?Access Code: [email or web address] ?URL: [email or web address] ?Date: 09/26/2021 ?Prepared by: Raeford Razor ? ?Exercises ?- Seated Hamstring Stretch  - 2 x daily - 7 x weekly - 1 sets - 3 reps - 30 hold ?- Seated Flexion Stretch with Swiss Ball  - 2 x daily - 7 x weekly - 1 sets - 6 reps - 20 hold ?- Supine Bridge  - 1 x daily - 7 x weekly - 2 sets - 10 reps - 5 hold ?- Supine Lower Trunk Rotation  - 1 x daily - 7 x weekly - 2 sets - 10 reps - 10 hold ?-  Cat Cow to Child's Pose  - 1 x daily - 7 x weekly - 2 sets - 10 reps - 10 hold ?- Bird Dog  - 1 x daily - 7 x weekly - 2 sets - 10 reps - 5 hold  ?ASSESSMENT: ?  ?CLINICAL IMPRESSION: ?Patient tolerated session

## 2021-09-26 ENCOUNTER — Ambulatory Visit: Payer: Medicare Other | Attending: Internal Medicine | Admitting: Physical Therapy

## 2021-09-26 DIAGNOSIS — M545 Low back pain, unspecified: Secondary | ICD-10-CM | POA: Diagnosis not present

## 2021-09-26 DIAGNOSIS — R252 Cramp and spasm: Secondary | ICD-10-CM | POA: Diagnosis not present

## 2021-09-26 DIAGNOSIS — G8929 Other chronic pain: Secondary | ICD-10-CM | POA: Insufficient documentation

## 2021-09-26 DIAGNOSIS — M6281 Muscle weakness (generalized): Secondary | ICD-10-CM | POA: Diagnosis not present

## 2021-09-27 NOTE — Therapy (Incomplete)
?OUTPATIENT PHYSICAL THERAPY TREATMENT NOTE ? ? ?Patient Name: Richard Torres ?MRN: [id number] ?DOB:01-11-1946, 76 y.o., male ?Today's Date: 09/27/2021 ? ? ?END OF SESSION:  ? ? ? ?Past Medical History:  ?Diagnosis Date  ? Acute embolism and thrombosis of unspecified deep veins of unspecified lower extremity   ? Asymptomatic varicose veins of bilateral lower extremities   ? Chronic kidney disease, stage 3a   ? Chronic pain   ? Clotting disorder   ? hx clot in left leg/right leg  ? DJD (degenerative joint disease)   ? ED (erectile dysfunction) of organic origin   ? Elevated liver enzymes 07/21/2011  ? Environmental allergies   ? Essential hypertension   ? Generalized anxiety disorder   ? Headache 07/21/2011  ? Hematuria syndrome   ? Herniated lumbar disc without myelopathy 03/08/2020  ? Hypoglycemia due to type 2 diabetes mellitus   ? Incisional hernia, without obstruction or gangrene 02/10/2013  ? Insomnia   ? Low back pain   ? Major depressive disorder   ? Malignant tumor of prostate   ? Mild cognitive impairment with memory loss 08/19/2021  ? Mixed hyperlipidemia   ? Motion sickness   ? Normocytic anemia 07/21/2011  ? Pharyngitis   ? Polyneuropathy due to type 2 diabetes mellitus   ? Polyp of colon   ? Renal insufficiency 07/21/2011  ? Sciatica   ? Splenomegaly 07/21/2011  ? Stroke   ? 2011 MRI - Tiny acute infarct anterior medial left temporal lobe  ? Type II diabetes mellitus   ? Umbilical hernia   ? Vitamin D deficiency   ? ?Past Surgical History:  ?Procedure Laterality Date  ? bilateral inguinal heria repairs    ? cholecystectomy    ? CHOLECYSTECTOMY    ? COLONOSCOPY    ? EYE SURGERY Bilateral   ? cataracts removed  ? HERNIA REPAIR    ? LUMBAR LAMINECTOMY/ DECOMPRESSION WITH MET-RX Right 03/08/2020  ? Procedure: Right Lumbar Five Sacral OneExtraforaminal microdiscectomy;  Surgeon: Erline Levine, MD;  Location: [facility];  Service: Neurosurgery;  Laterality: Right;  ? PROSTATECTOMY    ? VARICOSE VEIN SURGERY  Bilateral   ? laser - legs  ? ?Patient Active Problem List  ? Diagnosis Date Noted  ? Mild cognitive impairment with memory loss 08/19/2021  ? Acute embolism and thrombosis of unspecified deep veins of unspecified lower extremity   ? Generalized anxiety disorder   ? Chronic kidney disease, stage 3a   ? Chronic pain   ? ED (erectile dysfunction) of organic origin   ? Stroke   ? Essential hypertension   ? Hematuria syndrome   ? Mixed hyperlipidemia   ? Type II diabetes mellitus   ? Hypoglycemia due to type 2 diabetes mellitus   ? Insomnia   ? Low back pain   ? Major depressive disorder   ? Malignant tumor of prostate ([place])   ? Motion sickness   ? Pharyngitis   ? Polyneuropathy due to type 2 diabetes mellitus   ? Polyp of colon   ? Sciatica   ? Umbilical hernia   ? Asymptomatic varicose veins of bilateral lower extremities   ? Vitamin D deficiency   ? Herniated lumbar disc without myelopathy 03/08/2020  ? Incisional hernia, without obstruction or gangrene 02/10/2013  ? Headache 07/21/2011  ? Splenomegaly 07/21/2011  ? Elevated liver enzymes 07/21/2011  ? Normocytic anemia 07/21/2011  ? Renal insufficiency 07/21/2011  ? ? ? ?REFERRING PROVIDER: Kathalene Frames, MD  ? ?  REFERRING DIAG: Diagnosis M54.2 (ICD-10-CM) - Cervicalgia M54.50 (ICD-10-CM) - Low back pain, unspecified  ? ?THERAPY DIAG:  ?No diagnosis found. ? ?PERTINENT HISTORY: cognitive impairments, memory and learning ? ?PRECAUTIONS: cog. ? ?SUBJECTIVE: I did the stretches, has some pain but it never interferes too much.   ? ?PAIN:  ?Are you having pain? Yes: NPRS scale: 5/10 ?Pain location: low back  ?Pain description: achy ,sore  ?Aggravating factors: sitting, AM  ?Relieving factors: stretching  ? ?Are you having pain? Yes: NPRS scale: 5/10 ?Pain location: post cervical, skull and along each side  ?Pain description: tightness , achy ?Aggravating factors: AM  ?Relieving factors: as the day goes on it gets better  ? ? ? ? ?OBJECTIVE: (objective measures  completed at initial evaluation unless otherwise dated) ?  ?OBJECTIVE:  ?  ?DIAGNOSTIC FINDINGS:  ?Lumbar Xray 08/17/21 ?IMPRESSION: ?No recent fracture is seen in the lumbar spine. Lumbar spondylosis ?with bony spurs, disc space narrowing, facet hypertrophy, spinal ?stenosis and encroachment of neural foramina, especially severe from ?L3-S1 levels. ?  ? Cervical CT 05/10/21 ?IMPRESSION: ?1. Negative CT head ?2. Negative for facial fracture ?3. Cervical spondylosis.  Negative for cervical fracture. ?  ?PATIENT SURVEYS:  ?FOTO done 09/26/21: 53%, goal 72% ?  ?COGNITION: ?          Overall cognitive status: Within functional limits for tasks assessed               ?           ?SENSATION: ?WFL ?  ?MUSCLE LENGTH: ?Hamstrings: Right 38 deg; Left 36 deg ?Thomas test: Right TBA deg; Left TBA deg ?  ?POSTURE:  ?Decreased lordosis ?  ?PALPATION: ?Not TTP of the lumbar paraspinal  ?  ?LUMBAR ROM:  ?  ?Active  A/PROM  ?09/16/2021  ?Flexion Markedly limited, stiff  ?Extension Min limited , pressure  ?Right lateral flexion Mod limited, stiff  ?Left lateral flexion Mod limited stiff  ?Right rotation Mod limited, stiff  ?Left rotation Mod limited stiff  ? (Blank rows = not tested) ?  ?LUMBAR SPECIAL TESTS:  ?Straight leg raise test: Negative, Slump test: Negative, and SI Compression/distraction test: Positive for compression, neg for distraction ? ?Cervical AROM: ?Flexion 55 ?Extension 46  ?Rot Rt 37 ?Rot L 35 ?Rt lateral flexion 20 ?Lt lateral flexion  15 ? ? ?LE ROM: ?                      Grossly WNLs ?  ?LE MMT: ?                      4+ to 5/5 ecept for hip abd and ext at 4-/5 ?  ?FUNCTIONAL TESTS:  ?None- no significant functional limitations observed ?  ?GAIT: ?Distance walked: 200' in th clinic ?Assistive device utilized: None ?Level of assistance: Complete Independence ?Comments: WNLs ?  ?TODAY'S TREATMENT: ?- Seated Hamstring Stretch  3 reps - 30 hold ?- Seated Flexion Stretch with Swiss Ball  - 2 reps each - 20 hold  forforward and laterally ?  ? ?[facility] Adult PT Treatment:                                                DATE: 09/28/21 ?Therapeutic Exercise: ?*** ?Manual Therapy: ?*** ?Neuromuscular re-ed: ?*** ?Therapeutic Activity: ?*** ?Modalities: ?*** ?Self  Care: ?*** ? ?[facility] Adult PT Treatment:                                                DATE: 09/26/21 ?Therapeutic Exercise: ?Nustep L 6 UE and LE for 5 min  warm up and subjective  ?Supine LTR with head turns x 10  ?Supine hamstring stretch/strap 30 sec x 3  ?Knee to chest with rotation  ?Bridging articulating x 10 cues needed  ?Quadruped cat and camel x 10  ?Ardine Eng pose x 3  ?Bird dog for core UE x 5 each then LE x 5 each then combo x 5 each  ? ?Self Care: ?FOTO score   ? ?PATIENT EDUCATION:  ?Education details: Eval findings, POC, HEP, sleeping positions and support for comfort ?Person educated: Patient ?Education method: Explanation, Demonstration, Tactile cues, Verbal cues, and Handouts ?Education comprehension: verbalized understanding, returned demonstration, verbal cues required, and tactile cues required ?  ?  ?HOME EXERCISE PROGRAM: ?Access Code: [email or web address] ?URL: [email or web address] ?Date: 09/16/2021 ?Prepared by: Gar Ponto ?Access Code: [email or web address] ?URL: [email or web address] ?Date: 09/26/2021 ?Prepared by: Raeford Razor ? ?Exercises ?- Seated Hamstring Stretch  - 2 x daily - 7 x weekly - 1 sets - 3 reps - 30 hold ?- Seated Flexion Stretch with Swiss Ball  - 2 x daily - 7 x weekly - 1 sets - 6 reps - 20 hold ?- Supine Bridge  - 1 x daily - 7 x weekly - 2 sets - 10 reps - 5 hold ?- Supine Lower Trunk Rotation  - 1 x daily - 7 x weekly - 2 sets - 10 reps - 10 hold ?- Cat Cow to Child's Pose  - 1 x daily - 7 x weekly - 2 sets - 10 reps - 10 hold ?- Bird Dog  - 1 x daily - 7 x weekly - 2 sets - 10 reps - 5 hold  ?ASSESSMENT: ?  ?CLINICAL IMPRESSION: ?Patient tolerated session well without increased pain.  Addressed stiffness throughout spine and  added to HEP to further improve mobility.  Receptive to info regarding pilates, yoga as well as building habit of exercise/mobility daily.  ?  ?  ?OBJECTIVE IMPAIRMENTS decreased balance, decreased knowledge of c

## 2021-09-28 ENCOUNTER — Ambulatory Visit: Payer: Medicare Other

## 2021-09-29 DIAGNOSIS — R413 Other amnesia: Secondary | ICD-10-CM | POA: Diagnosis not present

## 2021-09-29 DIAGNOSIS — E119 Type 2 diabetes mellitus without complications: Secondary | ICD-10-CM | POA: Diagnosis not present

## 2021-09-29 DIAGNOSIS — H524 Presbyopia: Secondary | ICD-10-CM | POA: Diagnosis not present

## 2021-09-29 DIAGNOSIS — E1142 Type 2 diabetes mellitus with diabetic polyneuropathy: Secondary | ICD-10-CM | POA: Diagnosis not present

## 2021-09-29 DIAGNOSIS — E538 Deficiency of other specified B group vitamins: Secondary | ICD-10-CM | POA: Diagnosis not present

## 2021-09-29 DIAGNOSIS — H35373 Puckering of macula, bilateral: Secondary | ICD-10-CM | POA: Diagnosis not present

## 2021-09-29 DIAGNOSIS — H35413 Lattice degeneration of retina, bilateral: Secondary | ICD-10-CM | POA: Diagnosis not present

## 2021-09-29 DIAGNOSIS — H26493 Other secondary cataract, bilateral: Secondary | ICD-10-CM | POA: Diagnosis not present

## 2021-09-29 DIAGNOSIS — N1831 Chronic kidney disease, stage 3a: Secondary | ICD-10-CM | POA: Diagnosis not present

## 2021-10-03 ENCOUNTER — Ambulatory Visit: Payer: Medicare Other | Admitting: Physical Therapy

## 2021-10-03 ENCOUNTER — Encounter: Payer: Self-pay | Admitting: Physical Therapy

## 2021-10-03 DIAGNOSIS — M6281 Muscle weakness (generalized): Secondary | ICD-10-CM | POA: Diagnosis not present

## 2021-10-03 DIAGNOSIS — R252 Cramp and spasm: Secondary | ICD-10-CM | POA: Diagnosis not present

## 2021-10-03 DIAGNOSIS — M545 Low back pain, unspecified: Secondary | ICD-10-CM

## 2021-10-03 DIAGNOSIS — G8929 Other chronic pain: Secondary | ICD-10-CM | POA: Diagnosis not present

## 2021-10-03 NOTE — Therapy (Signed)
?OUTPATIENT PHYSICAL THERAPY TREATMENT NOTE ? ? ?Patient Name: Richard Torres ?MRN: [id number] ?DOB:25-Oct-1945, 76 y.o., male ?Today's Date: 10/03/2021 ? ? ?END OF SESSION:  ? PT End of Session - 10/03/21 0801   ? ? Visit Number 3   ? Number of Visits 13   ? Date for PT Re-Evaluation 11/04/21   ? Authorization Type MEDICARE PART A AND B   ? Authorization Time Period [employer]   ? PT Start Time 0801   ? PT Stop Time 0845   ? PT Time Calculation (min) 44 min   ? Activity Tolerance Patient tolerated treatment well   ? Behavior During Therapy [facility] for tasks assessed/performed   ? ?  ?  ? ?  ? ? ? ?Past Medical History:  ?Diagnosis Date  ? Acute embolism and thrombosis of unspecified deep veins of unspecified lower extremity   ? Asymptomatic varicose veins of bilateral lower extremities   ? Chronic kidney disease, stage 3a   ? Chronic pain   ? Clotting disorder   ? hx clot in left leg/right leg  ? DJD (degenerative joint disease)   ? ED (erectile dysfunction) of organic origin   ? Elevated liver enzymes 07/21/2011  ? Environmental allergies   ? Essential hypertension   ? Generalized anxiety disorder   ? Headache 07/21/2011  ? Hematuria syndrome   ? Herniated lumbar disc without myelopathy 03/08/2020  ? Hypoglycemia due to type 2 diabetes mellitus   ? Incisional hernia, without obstruction or gangrene 02/10/2013  ? Insomnia   ? Low back pain   ? Major depressive disorder   ? Malignant tumor of prostate   ? Mild cognitive impairment with memory loss 08/19/2021  ? Mixed hyperlipidemia   ? Motion sickness   ? Normocytic anemia 07/21/2011  ? Pharyngitis   ? Polyneuropathy due to type 2 diabetes mellitus   ? Polyp of colon   ? Renal insufficiency 07/21/2011  ? Sciatica   ? Splenomegaly 07/21/2011  ? Stroke   ? 2011 MRI - Tiny acute infarct anterior medial left temporal lobe  ? Type II diabetes mellitus   ? Umbilical hernia   ? Vitamin D deficiency   ? ?Past Surgical History:  ?Procedure Laterality Date  ?  bilateral inguinal heria repairs    ? cholecystectomy    ? CHOLECYSTECTOMY    ? COLONOSCOPY    ? EYE SURGERY Bilateral   ? cataracts removed  ? HERNIA REPAIR    ? LUMBAR LAMINECTOMY/ DECOMPRESSION WITH MET-RX Right 03/08/2020  ? Procedure: Right Lumbar Five Sacral OneExtraforaminal microdiscectomy;  Surgeon: Erline Levine, MD;  Location: [place];  Service: Neurosurgery;  Laterality: Right;  ? PROSTATECTOMY    ? VARICOSE VEIN SURGERY Bilateral   ? laser - legs  ? ?Patient Active Problem List  ? Diagnosis Date Noted  ? Mild cognitive impairment with memory loss 08/19/2021  ? Acute embolism and thrombosis of unspecified deep veins of unspecified lower extremity   ? Generalized anxiety disorder   ? Chronic kidney disease, stage 3a   ? Chronic pain   ? ED (erectile dysfunction) of organic origin   ? Stroke   ? Essential hypertension   ? Hematuria syndrome   ? Mixed hyperlipidemia   ? Type II diabetes mellitus   ? Hypoglycemia due to type 2 diabetes mellitus   ? Insomnia   ? Low back pain   ? Major depressive disorder   ? Malignant tumor of prostate ([place])   ?  Motion sickness   ? Pharyngitis   ? Polyneuropathy due to type 2 diabetes mellitus   ? Polyp of colon   ? Sciatica   ? Umbilical hernia   ? Asymptomatic varicose veins of bilateral lower extremities   ? Vitamin D deficiency   ? Herniated lumbar disc without myelopathy 03/08/2020  ? Incisional hernia, without obstruction or gangrene 02/10/2013  ? Headache 07/21/2011  ? Splenomegaly 07/21/2011  ? Elevated liver enzymes 07/21/2011  ? Normocytic anemia 07/21/2011  ? Renal insufficiency 07/21/2011  ? ? ? ?REFERRING PROVIDER: Kathalene Frames, MD  ? ?REFERRING DIAG: Diagnosis M54.2 (ICD-10-CM) - Cervicalgia M54.50 (ICD-10-CM) - Low back pain, unspecified  ? ?THERAPY DIAG:  ?Chronic midline low back pain without sciatica ? ?Cramp and spasm ? ?Muscle weakness (generalized) ? ?PERTINENT HISTORY: cognitive impairments, memory and learning ? ?PRECAUTIONS: cog. ? ?SUBJECTIVE:  Patient reports no issues out of the ordinary this weekend.  No pain right now in low back.  Neck pain is rated 4/10 ? ?PAIN:  ?Are you having pain? Yes: NPRS scale: 0/10 ?Pain location: low back  ?Pain description: achy ,sore  ?Aggravating factors: sitting, AM  ?Relieving factors: stretching  ? ?Are you having pain? Yes: NPRS scale: 4/10 ?Pain location: post cervical, skull and along each side  ?Pain description: tightness , achy ?Aggravating factors: AM  ?Relieving factors: as the day goes on it gets better  ? ? ? ? ?OBJECTIVE: (objective measures completed at initial evaluation unless otherwise dated) ?  ?OBJECTIVE:  ?  ?DIAGNOSTIC FINDINGS:  ?Lumbar Xray 08/17/21 ?IMPRESSION: ?No recent fracture is seen in the lumbar spine. Lumbar spondylosis ?with bony spurs, disc space narrowing, facet hypertrophy, spinal ?stenosis and encroachment of neural foramina, especially severe from ?L3-S1 levels. ?  ? Cervical CT 05/10/21 ?IMPRESSION: ?1. Negative CT head ?2. Negative for facial fracture ?3. Cervical spondylosis.  Negative for cervical fracture. ?  ?PATIENT SURVEYS:  ?FOTO done 09/26/21: 53%, goal 72% ?  ?COGNITION: ?          Overall cognitive status: Within functional limits for tasks assessed               ?           ?SENSATION: ?WFL ?  ?MUSCLE LENGTH: ?Hamstrings: Right 38 deg; Left 36 deg ?Thomas test: Right TBA deg; Left TBA deg ?  ?POSTURE:  ?Decreased lordosis ?  ?PALPATION: ?Not TTP of the lumbar paraspinal  ?  ?LUMBAR ROM:  ?  ?Active  A/PROM  ?09/16/2021  ?Flexion Markedly limited, stiff  ?Extension Min limited , pressure  ?Right lateral flexion Mod limited, stiff  ?Left lateral flexion Mod limited stiff  ?Right rotation Mod limited, stiff  ?Left rotation Mod limited stiff  ? (Blank rows = not tested) ?  ?LUMBAR SPECIAL TESTS:  ?Straight leg raise test: Negative, Slump test: Negative, and SI Compression/distraction test: Positive for compression, neg for distraction ? ?Cervical AROM: ?Flexion 55 ?Extension 46   ?Rot Rt 37 ?Rot L 35 ?Rt lateral flexion 20 ?Lt lateral flexion  15 ? ? ?LE ROM: ?                      Grossly WNLs ?  ?LE MMT: ?                      4+ to 5/5 ecept for hip abd and ext at 4-/5 ?  ?FUNCTIONAL TESTS:  ?None- no significant functional limitations observed ?  ?  GAIT: ?Distance walked: 200' in th clinic ?Assistive device utilized: None ?Level of assistance: Complete Independence ?Comments: WNLs ?  ?TODAY'S TREATMENT: ?- Seated Hamstring Stretch  3 reps - 30 hold ?- Seated Flexion Stretch with Swiss Ball  - 2 reps each - 20 hold for forward and laterally ?  ?[facility] Adult PT Treatment:                                                DATE: 10/03/21 ?Therapeutic Exercise: ?NuStep L6 UE and LE for warm up ?Seated cervical extension with towel  ?Seated cervical stretches: upper trap, rotation and levator scapula using towel for anchor ?Quadruped cat and camel x 10  ?Ardine Eng pose x 3 added lateral trunk bias  ?Bird dog for core UE/LE x 10  ? ?Manual Therapy: ?Suboccipital release  ?Posterior cervicals ?PROM rotation ? ?[facility] Adult PT Treatment:                                                DATE: 09/26/21 ?Therapeutic Exercise: ?Nustep L 6 UE and LE for 5 min  warm up and subjective  ?Supine LTR with head turns x 10  ?Supine hamstring stretch/strap 30 sec x 3  ?Knee to chest with rotation  ?Bridging articulating x 10 cues needed  ?Quadruped cat and camel x 10  ?Ardine Eng pose x 3  ?Bird dog for core UE x 5 each then LE x 5 each then combo x 5 each  ? ?Self Care: ?FOTO score   ? ?PATIENT EDUCATION:  ?Education details: Eval findings, POC, HEP, sleeping positions and support for comfort ?Person educated: Patient ?Education method: Explanation, Demonstration, Tactile cues, Verbal cues, and Handouts ?Education comprehension: verbalized understanding, returned demonstration, verbal cues required, and tactile cues required ?  ?  ?HOME EXERCISE PROGRAM: ? ?Access Code: [email or web address] ?URL: [email or web address] ?Date:  09/26/2021 ?Prepared by: Raeford Razor ? ?Exercises ?- Seated Hamstring Stretch  - 2 x daily - 7 x weekly - 1 sets - 3 reps - 30 hold ?- Seated Flexion Stretch with Swiss Ball  - 2 x daily - 7 x weekly -

## 2021-10-04 DIAGNOSIS — Z8673 Personal history of transient ischemic attack (TIA), and cerebral infarction without residual deficits: Secondary | ICD-10-CM | POA: Diagnosis not present

## 2021-10-04 DIAGNOSIS — R413 Other amnesia: Secondary | ICD-10-CM | POA: Diagnosis not present

## 2021-10-04 NOTE — Therapy (Incomplete)
?OUTPATIENT PHYSICAL THERAPY TREATMENT NOTE ? ? ?Patient Name: Richard Torres ?MRN: [id number] ?DOB:08-10-1945, 76 y.o., male ?Today's Date: 10/04/2021 ? ? ?END OF SESSION:  ? ? ? ? ?Past Medical History:  ?Diagnosis Date  ? Acute embolism and thrombosis of unspecified deep veins of unspecified lower extremity   ? Asymptomatic varicose veins of bilateral lower extremities   ? Chronic kidney disease, stage 3a   ? Chronic pain   ? Clotting disorder   ? hx clot in left leg/right leg  ? DJD (degenerative joint disease)   ? ED (erectile dysfunction) of organic origin   ? Elevated liver enzymes 07/21/2011  ? Environmental allergies   ? Essential hypertension   ? Generalized anxiety disorder   ? Headache 07/21/2011  ? Hematuria syndrome   ? Herniated lumbar disc without myelopathy 03/08/2020  ? Hypoglycemia due to type 2 diabetes mellitus   ? Incisional hernia, without obstruction or gangrene 02/10/2013  ? Insomnia   ? Low back pain   ? Major depressive disorder   ? Malignant tumor of prostate   ? Mild cognitive impairment with memory loss 08/19/2021  ? Mixed hyperlipidemia   ? Motion sickness   ? Normocytic anemia 07/21/2011  ? Pharyngitis   ? Polyneuropathy due to type 2 diabetes mellitus   ? Polyp of colon   ? Renal insufficiency 07/21/2011  ? Sciatica   ? Splenomegaly 07/21/2011  ? Stroke   ? 2011 MRI - Tiny acute infarct anterior medial left temporal lobe  ? Type II diabetes mellitus   ? Umbilical hernia   ? Vitamin D deficiency   ? ?Past Surgical History:  ?Procedure Laterality Date  ? bilateral inguinal heria repairs    ? cholecystectomy    ? CHOLECYSTECTOMY    ? COLONOSCOPY    ? EYE SURGERY Bilateral   ? cataracts removed  ? HERNIA REPAIR    ? LUMBAR LAMINECTOMY/ DECOMPRESSION WITH MET-RX Right 03/08/2020  ? Procedure: Right Lumbar Five Sacral OneExtraforaminal microdiscectomy;  Surgeon: Erline Levine, MD;  Location: [place];  Service: Neurosurgery;  Laterality: Right;  ? PROSTATECTOMY    ? VARICOSE VEIN SURGERY  Bilateral   ? laser - legs  ? ?Patient Active Problem List  ? Diagnosis Date Noted  ? Mild cognitive impairment with memory loss 08/19/2021  ? Acute embolism and thrombosis of unspecified deep veins of unspecified lower extremity   ? Generalized anxiety disorder   ? Chronic kidney disease, stage 3a   ? Chronic pain   ? ED (erectile dysfunction) of organic origin   ? Stroke   ? Essential hypertension   ? Hematuria syndrome   ? Mixed hyperlipidemia   ? Type II diabetes mellitus   ? Hypoglycemia due to type 2 diabetes mellitus   ? Insomnia   ? Low back pain   ? Major depressive disorder   ? Malignant tumor of prostate ([place])   ? Motion sickness   ? Pharyngitis   ? Polyneuropathy due to type 2 diabetes mellitus   ? Polyp of colon   ? Sciatica   ? Umbilical hernia   ? Asymptomatic varicose veins of bilateral lower extremities   ? Vitamin D deficiency   ? Herniated lumbar disc without myelopathy 03/08/2020  ? Incisional hernia, without obstruction or gangrene 02/10/2013  ? Headache 07/21/2011  ? Splenomegaly 07/21/2011  ? Elevated liver enzymes 07/21/2011  ? Normocytic anemia 07/21/2011  ? Renal insufficiency 07/21/2011  ? ? ? ?REFERRING PROVIDER: Kathalene Frames, MD  ? ?  REFERRING DIAG: Diagnosis M54.2 (ICD-10-CM) - Cervicalgia M54.50 (ICD-10-CM) - Low back pain, unspecified  ? ?THERAPY DIAG:  ?No diagnosis found. ? ?PERTINENT HISTORY: cognitive impairments, memory and learning ? ?PRECAUTIONS: cog. ? ?SUBJECTIVE: Patient reports no issues out of the ordinary this weekend.  No pain right now in low back.  Neck pain is rated 4/10 ? ?PAIN:  ?Are you having pain? Yes: NPRS scale: 0/10 ?Pain location: low back  ?Pain description: achy ,sore  ?Aggravating factors: sitting, AM  ?Relieving factors: stretching  ? ?Are you having pain? Yes: NPRS scale: 4/10 ?Pain location: post cervical, skull and along each side  ?Pain description: tightness , achy ?Aggravating factors: AM  ?Relieving factors: as the day goes on it gets  better  ? ? ? ? ?OBJECTIVE: (objective measures completed at initial evaluation unless otherwise dated) ?  ?OBJECTIVE:  ?  ?DIAGNOSTIC FINDINGS:  ?Lumbar Xray 08/17/21 ?IMPRESSION: ?No recent fracture is seen in the lumbar spine. Lumbar spondylosis ?with bony spurs, disc space narrowing, facet hypertrophy, spinal ?stenosis and encroachment of neural foramina, especially severe from ?L3-S1 levels. ?  ? Cervical CT 05/10/21 ?IMPRESSION: ?1. Negative CT head ?2. Negative for facial fracture ?3. Cervical spondylosis.  Negative for cervical fracture. ?  ?PATIENT SURVEYS:  ?FOTO done 09/26/21: 53%, goal 72% ?  ?COGNITION: ?          Overall cognitive status: Within functional limits for tasks assessed               ?           ?SENSATION: ?WFL ?  ?MUSCLE LENGTH: ?Hamstrings: Right 38 deg; Left 36 deg ?Thomas test: Right TBA deg; Left TBA deg ?  ?POSTURE:  ?Decreased lordosis ?  ?PALPATION: ?Not TTP of the lumbar paraspinal  ?  ?LUMBAR ROM:  ?  ?Active  A/PROM  ?09/16/2021  ?Flexion Markedly limited, stiff  ?Extension Min limited , pressure  ?Right lateral flexion Mod limited, stiff  ?Left lateral flexion Mod limited stiff  ?Right rotation Mod limited, stiff  ?Left rotation Mod limited stiff  ? (Blank rows = not tested) ?  ?LUMBAR SPECIAL TESTS:  ?Straight leg raise test: Negative, Slump test: Negative, and SI Compression/distraction test: Positive for compression, neg for distraction ? ?Cervical AROM: ?Flexion 55 ?Extension 46  ?Rot Rt 37 ?Rot L 35 ?Rt lateral flexion 20 ?Lt lateral flexion  15 ? ? ?LE ROM: ?                      Grossly WNLs ?  ?LE MMT: ?                      4+ to 5/5 ecept for hip abd and ext at 4-/5 ?  ?FUNCTIONAL TESTS:  ?None- no significant functional limitations observed ?  ?GAIT: ?Distance walked: 200' in th clinic ?Assistive device utilized: None ?Level of assistance: Complete Independence ?Comments: WNLs ?  ?TODAY'S TREATMENT: ?- Seated Hamstring Stretch  3 reps - 30 hold ?- Seated Flexion Stretch  with Swiss Ball  - 2 reps each - 20 hold for forward and laterally ? ?[facility] Adult PT Treatment:                                                DATE: 10/05/21 ?Therapeutic Exercise: ?*** ?Manual Therapy: ?*** ?Neuromuscular  re-ed: ?*** ?Therapeutic Activity: ?*** ?Modalities: ?*** ?Self Care: ?*** ? ?  ?[facility] Adult PT Treatment:                                                DATE: 10/03/21 ?Therapeutic Exercise: ?NuStep L6 UE and LE for warm up ?Seated cervical extension with towel  ?Seated cervical stretches: upper trap, rotation and levator scapula using towel for anchor ?Quadruped cat and camel x 10  ?Ardine Eng pose x 3 added lateral trunk bias  ?Bird dog for core UE/LE x 10  ? ?Manual Therapy: ?Suboccipital release  ?Posterior cervicals ?PROM rotation ? ?[facility] Adult PT Treatment:                                                DATE: 09/26/21 ?Therapeutic Exercise: ?Nustep L 6 UE and LE for 5 min  warm up and subjective  ?Supine LTR with head turns x 10  ?Supine hamstring stretch/strap 30 sec x 3  ?Knee to chest with rotation  ?Bridging articulating x 10 cues needed  ?Quadruped cat and camel x 10  ?Ardine Eng pose x 3  ?Bird dog for core UE x 5 each then LE x 5 each then combo x 5 each  ? ?Self Care: ?FOTO score   ? ?PATIENT EDUCATION:  ?Education details: Eval findings, POC, HEP, sleeping positions and support for comfort ?Person educated: Patient ?Education method: Explanation, Demonstration, Tactile cues, Verbal cues, and Handouts ?Education comprehension: verbalized understanding, returned demonstration, verbal cues required, and tactile cues required ?  ?  ?HOME EXERCISE PROGRAM: ? ?Access Code: [email or web address] ?URL: [email or web address] ?Date: 09/26/2021 ?Prepared by: Raeford Razor ? ?Exercises ?- Seated Hamstring Stretch  - 2 x daily - 7 x weekly - 1 sets - 3 reps - 30 hold ?- Seated Flexion Stretch with Swiss Ball  - 2 x daily - 7 x weekly - 1 sets - 6 reps - 20 hold ?- Supine Bridge  - 1 x daily - 7 x weekly - 2  sets - 10 reps - 5 hold ?- Supine Lower Trunk Rotation  - 1 x daily - 7 x weekly - 2 sets - 10 reps - 10 hold ?- Cat Cow to Child's Pose  - 1 x daily - 7 x weekly - 2 sets - 10 reps - 10 hold ?Mar[facility] [place] Dog

## 2021-10-05 ENCOUNTER — Ambulatory Visit: Payer: Medicare Other

## 2021-10-06 ENCOUNTER — Other Ambulatory Visit: Payer: Self-pay | Admitting: Internal Medicine

## 2021-10-06 DIAGNOSIS — Z8673 Personal history of transient ischemic attack (TIA), and cerebral infarction without residual deficits: Secondary | ICD-10-CM

## 2021-10-10 ENCOUNTER — Encounter: Payer: Self-pay | Admitting: Physical Therapy

## 2021-10-10 ENCOUNTER — Other Ambulatory Visit: Payer: Self-pay | Admitting: Internal Medicine

## 2021-10-10 ENCOUNTER — Ambulatory Visit: Payer: Medicare Other | Admitting: Physical Therapy

## 2021-10-10 DIAGNOSIS — R252 Cramp and spasm: Secondary | ICD-10-CM | POA: Diagnosis not present

## 2021-10-10 DIAGNOSIS — G8929 Other chronic pain: Secondary | ICD-10-CM | POA: Diagnosis not present

## 2021-10-10 DIAGNOSIS — M545 Low back pain, unspecified: Secondary | ICD-10-CM | POA: Diagnosis not present

## 2021-10-10 DIAGNOSIS — Z8673 Personal history of transient ischemic attack (TIA), and cerebral infarction without residual deficits: Secondary | ICD-10-CM

## 2021-10-10 DIAGNOSIS — M6281 Muscle weakness (generalized): Secondary | ICD-10-CM | POA: Diagnosis not present

## 2021-10-10 NOTE — Therapy (Signed)
OUTPATIENT PHYSICAL THERAPY TREATMENT NOTE   Patient Name: Richard Torres MRN: [id number] DOB:[birth year], 76 y.o., male Today's Date: 10/10/2021   END OF SESSION:   PT End of Session - 10/10/21 0855     Visit Number 4    Number of Visits 13    Date for PT Re-Evaluation 11/04/21    Authorization Type MEDICARE PART A AND B    Authorization Time Period [employer]    PT Start Time 0853    PT Stop Time 0935    PT Time Calculation (min) 42 min    Activity Tolerance Patient tolerated treatment well    Behavior During Therapy WFL for tasks assessed/performed               Past Medical History:  Diagnosis Date   Acute embolism and thrombosis of unspecified deep veins of unspecified lower extremity    Asymptomatic varicose veins of bilateral lower extremities    Chronic kidney disease, stage 3a    Chronic pain    Clotting disorder    hx clot in left leg/right leg   DJD (degenerative joint disease)    ED (erectile dysfunction) of organic origin    Elevated liver enzymes 07/21/2011   Environmental allergies    Essential hypertension    Generalized anxiety disorder    Headache 07/21/2011   Hematuria syndrome    Herniated lumbar disc without myelopathy 03/08/2020   Hypoglycemia due to type 2 diabetes mellitus    Incisional hernia, without obstruction or gangrene 02/10/2013   Insomnia    Low back pain    Major depressive disorder    Malignant tumor of prostate    Mild cognitive impairment with memory loss 08/19/2021   Mixed hyperlipidemia    Motion sickness    Normocytic anemia 07/21/2011   Pharyngitis    Polyneuropathy due to type 2 diabetes mellitus    Polyp of colon    Renal insufficiency 07/21/2011   Sciatica    Splenomegaly 07/21/2011   Stroke    2011 MRI - Tiny acute infarct anterior medial left temporal lobe   Type II diabetes mellitus    Umbilical hernia    Vitamin D deficiency    Past Surgical History:  Procedure Laterality Date    bilateral inguinal heria repairs     cholecystectomy     CHOLECYSTECTOMY     COLONOSCOPY     EYE SURGERY Bilateral    cataracts removed   HERNIA REPAIR     LUMBAR LAMINECTOMY/ DECOMPRESSION WITH MET-RX Right 03/08/2020   Procedure: Right Lumbar Five Sacral OneExtraforaminal microdiscectomy;  Surgeon: Erline Levine, MD;  Location: [facility];  Service: Neurosurgery;  Laterality: Right;   PROSTATECTOMY     VARICOSE VEIN SURGERY Bilateral    laser - legs   Patient Active Problem List   Diagnosis Date Noted   Mild cognitive impairment with memory loss 08/19/2021   Acute embolism and thrombosis of unspecified deep veins of unspecified lower extremity    Generalized anxiety disorder    Chronic kidney disease, stage 3a    Chronic pain    ED (erectile dysfunction) of organic origin    Stroke    Essential hypertension    Hematuria syndrome    Mixed hyperlipidemia    Type II diabetes mellitus    Hypoglycemia due to type 2 diabetes mellitus    Insomnia    Low back pain    Major depressive disorder    Malignant tumor of prostate (  [place])    Motion sickness    Pharyngitis    Polyneuropathy due to type 2 diabetes mellitus    Polyp of colon    Sciatica    Umbilical hernia    Asymptomatic varicose veins of bilateral lower extremities    Vitamin D deficiency    Herniated lumbar disc without myelopathy 03/08/2020   Incisional hernia, without obstruction or gangrene 02/10/2013   Headache 07/21/2011   Splenomegaly 07/21/2011   Elevated liver enzymes 07/21/2011   Normocytic anemia 07/21/2011   Renal insufficiency 07/21/2011     REFERRING PROVIDER: Kathalene Frames, MD   REFERRING DIAG: Diagnosis M54.2 (ICD-10-CM) - Cervicalgia M54.50 (ICD-10-CM) - Low back pain, unspecified   THERAPY DIAG:  Chronic midline low back pain without sciatica  Cramp and spasm  Muscle weakness (generalized)  PERTINENT HISTORY: cognitive impairments, memory and learning  PRECAUTIONS: cog.  SUBJECTIVE:  Neck pain is 4/10, back is 3/10. Neck is definitely more of an issue than lumbar.   PAIN:  Are you having pain? Yes: NPRS scale: 3/10 Pain location: low back  Pain description: achy ,sore  Aggravating factors: sitting, AM  Relieving factors: stretching   Are you having pain? Yes: NPRS scale: 4/10 Pain location: post cervical, skull and along each side  Pain description: tightness , achy Aggravating factors: AM  Relieving factors: as the day goes on it gets better      OBJECTIVE: (objective measures completed at initial evaluation unless otherwise dated)      DIAGNOSTIC FINDINGS:  Lumbar Xray 08/17/21 IMPRESSION: No recent fracture is seen in the lumbar spine. Lumbar spondylosis with bony spurs, disc space narrowing, facet hypertrophy, spinal stenosis and encroachment of neural foramina, especially severe from L3-S1 levels.    Cervical CT 05/10/21 IMPRESSION: 1. Negative CT head 2. Negative for facial fracture 3. Cervical spondylosis.  Negative for cervical fracture.   PATIENT SURVEYS:  FOTO done 09/26/21: 53%, goal 72%   COGNITION:           Overall cognitive status: Within functional limits for tasks assessed                          SENSATION: WFL   MUSCLE LENGTH: Hamstrings: Right 38 deg; Left 36 deg Thomas test: Right TBA deg; Left TBA deg   POSTURE:  Decreased lordosis   PALPATION: Not TTP of the lumbar paraspinal    LUMBAR ROM:    Active  A/PROM  09/16/2021 AROM 10/10/21  Flexion Markedly limited, stiff   Extension Min limited , pressure   Right lateral flexion Mod limited, stiff   Left lateral flexion Mod limited stiff   Right rotation Mod limited, stiff   Left rotation Mod limited stiff    (Blank rows = not tested)   LUMBAR SPECIAL TESTS:  Straight leg raise test: Negative, Slump test: Negative, and SI Compression/distraction test: Positive for compression, neg for distraction  Cervical AROM: Flexion 55 Extension 46  Rot Rt 37 Rot L 35 Rt  lateral flexion 20 Lt lateral flexion  15   LE ROM:                       Grossly WNLs   LE MMT:                       4+ to 5/5 except for hip abd and ext at 4-/5   FUNCTIONAL TESTS:  None- no significant  functional limitations observed   GAIT: Distance walked: 200' in th clinic Assistive device utilized: None Level of assistance: Complete Independence Comments: WNLs      [facility] Adult PT Treatment:                                                DATE: 10/10/21 Therapeutic Exercise: Elliptical 5 min level 10 ramp and level 1 resistance  Seated upper trap stretch each side , 30 sec x 2  Levator 30 sec x 2 , used hand and towel for overpressure Corner stretch 30 sec x 3 , 30 sec  Quadruped bird dog x 5 each UE and LE simultaneous  Prone hip extension x 10 each  Prone upper back lift x 10 , used hands to assist  Sidelying book openings x 5 each side , mod cues  Bridging x 10    [facility] Adult PT Treatment:                                                DATE: 10/03/21 Therapeutic Exercise: NuStep L6 UE and LE for warm up Seated cervical extension with towel  Seated cervical stretches: upper trap, rotation and levator scapula using towel for anchor Quadruped cat and camel x 10  Childs pose x 3 added lateral trunk bias  Bird dog for core UE/LE x 10   Manual Therapy: Suboccipital release  Posterior cervicals PROM rotation  [facility] Adult PT Treatment:                                                DATE: 09/26/21 Therapeutic Exercise: Nustep L 6 UE and LE for 5 min  warm up and subjective  Supine LTR with head turns x 10  Supine hamstring stretch/strap 30 sec x 3  Knee to chest with rotation  Bridging articulating x 10 cues needed  Quadruped cat and camel x 10  Childs pose x 3  Bird dog for core UE x 5 each then LE x 5 each then combo x 5 each   Self Care: FOTO score    PATIENT EDUCATION:  Education details: HEP, core, spine mobility and posture, pilates v yoga.  Person  educated: Patient Education method: Explanation, Demonstration, Tactile cues, Verbal cues, and Handouts Education comprehension: verbalized understanding, returned demonstration, verbal cues required, and tactile cues required     HOME EXERCISE PROGRAM:  Access Code: [email or web address] URL: [email or web address] Date: 09/26/2021 Prepared by: Raeford Razor  Exercises - Seated Hamstring Stretch  - 2 x daily - 7 x weekly - 1 sets - 3 reps - 30 hold - Seated Flexion Stretch with Swiss Ball  - 2 x daily - 7 x weekly - 1 sets - 6 reps - 20 hold - Supine Bridge  - 1 x daily - 7 x weekly - 2 sets - 10 reps - 5 hold - Supine Lower Trunk Rotation  - 1 x daily - 7 x weekly - 2 sets - 10 reps - 10 hold - Cat Cow to Child's Pose  - 1 x daily - 7 x weekly -  2 sets - 10 reps - 10 hold - Bird Dog  - 1 x daily - 7 x weekly - 2 sets - 10 reps - 5 hold    - Upper trap   -Levator    -Pec doorway   ASSESSMENT:   CLINICAL IMPRESSION: Patient tolerated session well without increased pain.  Patient admits to doing exercises once in awhile.  The upper traps are his most "annoying" pain, is constant. Worked on spine mobility and anti-gravity postural muscles.  Needed moderate cues during session for technique and rationale.       OBJECTIVE IMPAIRMENTS decreased balance, decreased knowledge of condition, decreased ROM, decreased strength, impaired flexibility, and pain.    ACTIVITY LIMITATIONS driving, occupation, yard work, shopping, and sleep .    PERSONAL FACTORS Time since onset of injury/illness/exacerbation and 1-2 comorbidities: DJD, Hx of low abck surgery  are also affecting patient's functional outcome.      REHAB POTENTIAL: Good   CLINICAL DECISION MAKING: Stable/uncomplicated   EVALUATION COMPLEXITY: Low     GOALS:   SHORT TERM GOALS: Target date: 10/07/2021   Pt will be ind in an initial HEP Baseline: new Goal status: in cues needed   2.  Pt will voice understanding of measures  to assist in the reduction of his low back pain Baseline:  Goal status: met    LONG TERM GOALS: Target date: 11/04/21   Pt will report an improved ability to sleep by 50% Baseline:  Goal status: INITIAL   2.  Pt will report a decrease in early AM LBP to 3/10 for improved QOL Baseline:7/10 Goal status: INITIAL   3.  Pt will report a decrease in daytime LBP to 2 or less for improved QOL Baseline:  Goal status: INITIAL   4.  Increase bilat hip abd and ext strength to 4+/5 for decreased low back strain Baseline: 4_ Goal status: INITIAL   5.  Increased bilat hamstring flexibility to 48d to minimize low back strain Baseline: see flow sheets Goal status: INITIAL   6.  Pt will be Ind in a final HEP to maintain achieved LOF Baseline: Started on eval Goal status: INITIAL   7.  Pt's trunk will demonstrate improved flexibility with pt making at least minimal gains with each motion Baseline: see flow sheets Goal status: INITIAL     PLAN: PT FREQUENCY: 2x/week   PT DURATION: 6 weeks   PLANNED INTERVENTIONS: Therapeutic exercises, Therapeutic activity, Neuromuscular re-education, Balance training, Gait training, Patient/Family education, Aquatic Therapy, Dry Needling, Electrical stimulation, Spinal manipulation, Spinal mobilization, Cryotherapy, Moist heat, Taping, Traction, Ultrasound, Ionotophoresis 37m/ml Dexamethasone, and Manual therapy   PLAN FOR NEXT SESSION:  Progress there ex as indicated, Use of modalities, manual techniques, and TPDN as indicated.     JRaeford Razor PT 10/10/21 10:49 AM Phone: [phone]: [phone]

## 2021-10-12 ENCOUNTER — Ambulatory Visit: Payer: Medicare Other

## 2021-10-12 DIAGNOSIS — M6281 Muscle weakness (generalized): Secondary | ICD-10-CM

## 2021-10-12 DIAGNOSIS — R252 Cramp and spasm: Secondary | ICD-10-CM | POA: Diagnosis not present

## 2021-10-12 DIAGNOSIS — M545 Low back pain, unspecified: Secondary | ICD-10-CM | POA: Diagnosis not present

## 2021-10-12 DIAGNOSIS — G8929 Other chronic pain: Secondary | ICD-10-CM | POA: Diagnosis not present

## 2021-10-12 NOTE — Therapy (Signed)
OUTPATIENT PHYSICAL THERAPY TREATMENT NOTE   Patient Name: Richard Torres MRN: [id number] DOB:1946/02/08, 76 y.o., male Today's Date: 10/12/2021   END OF SESSION:   PT End of Session - 10/12/21 1332     Visit Number 5    Number of Visits 13    Date for PT Re-Evaluation 11/04/21    Authorization Type MEDICARE PART A AND B    Authorization Time Period [employer]    Progress Note Due on Visit 10    PT Start Time 0806    PT Stop Time 0850    PT Time Calculation (min) 44 min    Activity Tolerance Patient tolerated treatment well    Behavior During Therapy WFL for tasks assessed/performed                Past Medical History:  Diagnosis Date   Acute embolism and thrombosis of unspecified deep veins of unspecified lower extremity    Asymptomatic varicose veins of bilateral lower extremities    Chronic kidney disease, stage 3a    Chronic pain    Clotting disorder    hx clot in left leg/right leg   DJD (degenerative joint disease)    ED (erectile dysfunction) of organic origin    Elevated liver enzymes 07/21/2011   Environmental allergies    Essential hypertension    Generalized anxiety disorder    Headache 07/21/2011   Hematuria syndrome    Herniated lumbar disc without myelopathy 03/08/2020   Hypoglycemia due to type 2 diabetes mellitus    Incisional hernia, without obstruction or gangrene 02/10/2013   Insomnia    Low back pain    Major depressive disorder    Malignant tumor of prostate    Mild cognitive impairment with memory loss 08/19/2021   Mixed hyperlipidemia    Motion sickness    Normocytic anemia 07/21/2011   Pharyngitis    Polyneuropathy due to type 2 diabetes mellitus    Polyp of colon    Renal insufficiency 07/21/2011   Sciatica    Splenomegaly 07/21/2011   Stroke    2011 MRI - Tiny acute infarct anterior medial left temporal lobe   Type II diabetes mellitus    Umbilical hernia    Vitamin D deficiency    Past Surgical  History:  Procedure Laterality Date   bilateral inguinal heria repairs     cholecystectomy     CHOLECYSTECTOMY     COLONOSCOPY     EYE SURGERY Bilateral    cataracts removed   HERNIA REPAIR     LUMBAR LAMINECTOMY/ DECOMPRESSION WITH MET-RX Right 03/08/2020   Procedure: Right Lumbar Five Sacral OneExtraforaminal microdiscectomy;  Surgeon: Erline Levine, MD;  Location: [place];  Service: Neurosurgery;  Laterality: Right;   PROSTATECTOMY     VARICOSE VEIN SURGERY Bilateral    laser - legs   Patient Active Problem List   Diagnosis Date Noted   Mild cognitive impairment with memory loss 08/19/2021   Acute embolism and thrombosis of unspecified deep veins of unspecified lower extremity    Generalized anxiety disorder    Chronic kidney disease, stage 3a    Chronic pain    ED (erectile dysfunction) of organic origin    Stroke    Essential hypertension    Hematuria syndrome    Mixed hyperlipidemia    Type II diabetes mellitus    Hypoglycemia due to type 2 diabetes mellitus    Insomnia    Low back pain  Major depressive disorder    Malignant tumor of prostate ([place])    Motion sickness    Pharyngitis    Polyneuropathy due to type 2 diabetes mellitus    Polyp of colon    Sciatica    Umbilical hernia    Asymptomatic varicose veins of bilateral lower extremities    Vitamin D deficiency    Herniated lumbar disc without myelopathy 03/08/2020   Incisional hernia, without obstruction or gangrene 02/10/2013   Headache 07/21/2011   Splenomegaly 07/21/2011   Elevated liver enzymes 07/21/2011   Normocytic anemia 07/21/2011   Renal insufficiency 07/21/2011     REFERRING PROVIDER: Kathalene Frames, MD   REFERRING DIAG: Diagnosis M54.2 (ICD-10-CM) - Cervicalgia M54.50 (ICD-10-CM) - Low back pain, unspecified   THERAPY DIAG:  Chronic midline low back pain without sciatica  Cramp and spasm  Muscle weakness (generalized)  PERTINENT HISTORY: cognitive impairments, memory and  learning  PRECAUTIONS: cog.  SUBJECTIVE: pt   PAIN:  Are you having pain? Yes: NPRS scale: 4/10 Pain location: low back  Pain description: achy ,sore  Aggravating factors: sitting, AM  Relieving factors: stretching   Are you having pain? Yes: NPRS scale: 4/10 Pain location: post cervical, skull and along each side  Pain description: tightness , achy Aggravating factors: AM  Relieving factors: as the day goes on it gets better      OBJECTIVE: (objective measures completed at initial evaluation unless otherwise dated)   DIAGNOSTIC FINDINGS:  Lumbar Xray 08/17/21 IMPRESSION: No recent fracture is seen in the lumbar spine. Lumbar spondylosis with bony spurs, disc space narrowing, facet hypertrophy, spinal stenosis and encroachment of neural foramina, especially severe from L3-S1 levels.    Cervical CT 05/10/21 IMPRESSION: 1. Negative CT head 2. Negative for facial fracture 3. Cervical spondylosis.  Negative for cervical fracture.   PATIENT SURVEYS:  FOTO done 09/26/21: 53%, goal 72%   COGNITION:           Overall cognitive status: Within functional limits for tasks assessed                          SENSATION: WFL   MUSCLE LENGTH: Hamstrings: Right 38 deg; Left 36 deg Thomas test: Right TBA deg; Left TBA deg   POSTURE:  Decreased lordosis   PALPATION: Not TTP of the lumbar paraspinal    LUMBAR ROM:    Active  A/PROM  09/16/2021 AROM 10/10/21  Flexion Markedly limited, stiff   Extension Min limited , pressure   Right lateral flexion Mod limited, stiff   Left lateral flexion Mod limited stiff   Right rotation Mod limited, stiff   Left rotation Mod limited stiff    (Blank rows = not tested)   LUMBAR SPECIAL TESTS:  Straight leg raise test: Negative, Slump test: Negative, and SI Compression/distraction test: Positive for compression, neg for distraction  Cervical AROM: Flexion 55 Extension 46  Rot Rt 37 Rot L 35 Rt lateral flexion 20 Lt lateral flexion   15   LE ROM:                       Grossly WNLs   LE MMT:                       4+ to 5/5 except for hip abd and ext at 4-/5   FUNCTIONAL TESTS:  None- no significant functional limitations observed   GAIT: Distance walked:  200' in th clinic Assistive device utilized: None Level of assistance: Complete Independence Comments: WNLs   [facility] Adult PT Treatment:                                                DATE: 10/12/21 Therapeutic Exercise: UBE Upper trap stretch x2 30" Levator stretch x2 30" Cervical retraction 10x, 5" Updated HEP Manual Therapy: STM to the upper trap bilat Suboccipital release UPAs mobs C2-C6 Contract/relax lateral flexion mobs  Trigger Point Dry Needling Treatment: Skilled palpation for TPs and taut muscles for the upper traps Pre-treatment instruction: Patient instructed on dry needling rationale, procedures, and possible side effects including pain during treatment (achy,cramping feeling), bruising, drop of blood, lightheadedness, nausea, sweating. Patient Consent Given: Yes Education handout provided: Yes Muscles treated: Upper traps  Needle size and number:  .30x43m  2 needles Electrical stimulation performed: No Parameters: N/A Treatment response/outcome: Twitch response elicited and Palpable decrease in muscle tension Post-treatment instructions: Patient instructed to expect possible mild to moderate muscle soreness later today and/or tomorrow. Patient instructed in methods to reduce muscle soreness and to continue prescribed HEP. If patient was dry needled over the lung field, patient was instructed on signs and symptoms of pneumothorax and, however unlikely, to see immediate medical attention should they occur. Patient was also educated on signs and symptoms of infection and to seek medical attention should they occur. Patient verbalized understanding of these instructions and education  [facility]                                                 DATE: 10/10/21 Therapeutic Exercise: Elliptical 5 min level 10 ramp and level 1 resistance  Seated upper trap stretch each side , 30 sec x 2  Levator 30 sec x 2 , used hand and towel for overpressure Corner stretch 30 sec x 3 , 30 sec  Quadruped bird dog x 5 each UE and LE simultaneous  Prone hip extension x 10 each  Prone upper back lift x 10 , used hands to assist  Sidelying book openings x 5 each side , mod cues  Bridging x 10    [facility] Adult PT Treatment:                                                DATE: 10/03/21 Therapeutic Exercise: NuStep L6 UE and LE for warm up Seated cervical extension with towel  Seated cervical stretches: upper trap, rotation and levator scapula using towel for anchor Quadruped cat and camel x 10  Childs pose x 3 added lateral trunk bias  Bird dog for core UE/LE x 10   Manual Therapy: Suboccipital release  Posterior cervicals PROM rotation  [facility] Adult PT Treatment:                                                DATE: 09/26/21 Therapeutic Exercise: Nustep L 6 UE and LE for 5  min  warm up and subjective  Supine LTR with head turns x 10  Supine hamstring stretch/strap 30 sec x 3  Knee to chest with rotation  Bridging articulating x 10 cues needed  Quadruped cat and camel x 10  Childs pose x 3  Bird dog for core UE x 5 each then LE x 5 each then combo x 5 each   Self Care: FOTO score    PATIENT EDUCATION:  Education details: HEP, core, spine mobility and posture, pilates v yoga.  Person educated: Patient Education method: Explanation, Demonstration, Tactile cues, Verbal cues, and Handouts Education comprehension: verbalized understanding, returned demonstration, verbal cues required, and tactile cues required     HOME EXERCISE PROGRAM:  Access Code: [email or web address] URL: [email or web address] Date: 10/12/2021 Prepared by: Gar Ponto  Exercises - Seated Hamstring Stretch  - 2 x daily - 7 x weekly - 1 sets - 3 reps - 30  hold - Supine Bridge  - 1 x daily - 7 x weekly - 2 sets - 10 reps - 5 hold - Supine Lower Trunk Rotation  - 1 x daily - 7 x weekly - 2 sets - 10 reps - 10 hold - Cat Cow to Child's Pose  - 1 x daily - 7 x weekly - 2 sets - 10 reps - 10 hold - Bird Dog  - 1 x daily - 7 x weekly - 2 sets - 10 reps - 5 hold - Upper Trapezius Stretch  - 1-2 x daily - 7 x weekly - 1 sets - 3-5 reps - 30 hold - Seated Levator Scapulae Stretch  - 1-2 x daily - 7 x weekly - 1 sets - 3-5 reps - 30 hold - Corner Pec Major Stretch  - 1-2 x daily - 7 x weekly - 1 sets - 5 reps - 30 hold - Sidelying Thoracic Rotation with Open Book  - 1-2 x daily - 7 x weekly - 1 sets - 3-5 reps - 30 hold - Seated Passive Cervical Retraction  - 6 x daily - 7 x weekly - 1 sets - 5 reps - 1 hold   ASSESSMENT:   CLINICAL IMPRESSION: PT was completed to address neck pain and ROM. Manula therapy was f/b TPDN to the upper traps. Cervical retraction was added to the HEP to address posture and DNF strengthening. Pt tolerated the session without adverse effects. Pt overall is improving with OPPT with reports of decreased neck and back pain. Pt will continue to benefit from skilled PT to address impairments for improved neck and back function with less pain.   OBJECTIVE IMPAIRMENTS decreased balance, decreased knowledge of condition, decreased ROM, decreased strength, impaired flexibility, and pain.    ACTIVITY LIMITATIONS driving, occupation, yard work, shopping, and sleep .    PERSONAL FACTORS Time since onset of injury/illness/exacerbation and 1-2 comorbidities: DJD, Hx of low abck surgery  are also affecting patient's functional outcome.      REHAB POTENTIAL: Good   CLINICAL DECISION MAKING: Stable/uncomplicated   EVALUATION COMPLEXITY: Low     GOALS:   SHORT TERM GOALS: Target date: 10/07/2021   Pt will be ind in an initial HEP Baseline: new Goal status: in cues needed   2.  Pt will voice understanding of measures to assist in  the reduction of his low back pain Baseline:  Goal status: met    LONG TERM GOALS: Target date: 11/04/21   Pt will report an improved ability to sleep by 50% Baseline:  Goal  status: INITIAL   2.  Pt will report a decrease in early AM LBP to 3/10 for improved QOL Baseline:7/10 Goal status: INITIAL   3.  Pt will report a decrease in daytime LBP to 2 or less for improved QOL Baseline:  Goal status: INITIAL   4.  Increase bilat hip abd and ext strength to 4+/5 for decreased low back strain Baseline: 4_ Goal status: INITIAL   5.  Increased bilat hamstring flexibility to 48d to minimize low back strain Baseline: see flow sheets Goal status: INITIAL   6.  Pt will be Ind in a final HEP to maintain achieved LOF Baseline: Started on eval Goal status: INITIAL   7.  Pt's trunk will demonstrate improved flexibility with pt making at least minimal gains with each motion Baseline: see flow sheets Goal status: INITIAL     PLAN: PT FREQUENCY: 2x/week   PT DURATION: 6 weeks   PLANNED INTERVENTIONS: Therapeutic exercises, Therapeutic activity, Neuromuscular re-education, Balance training, Gait training, Patient/Family education, Aquatic Therapy, Dry Needling, Electrical stimulation, Spinal manipulation, Spinal mobilization, Cryotherapy, Moist heat, Taping, Traction, Ultrasound, Ionotophoresis 51m/ml Dexamethasone, and Manual therapy   PLAN FOR NEXT SESSION:  Progress there ex as indicated, Use of modalities, manual techniques, and TPDN as indicated. Assess STGs.    Nicklas Mcsweeney MS, PT 10/12/21 2:14 PM

## 2021-10-12 NOTE — Patient Instructions (Signed)

## 2021-10-18 NOTE — Therapy (Signed)
OUTPATIENT PHYSICAL THERAPY TREATMENT NOTE   Patient Name: Richard Torres MRN: [id number] DOB:27-Apr-1946, 76 y.o., male Today's Date: 10/19/2021   END OF SESSION:   PT End of Session - 10/19/21 0719     Visit Number 6    Number of Visits 13    Date for PT Re-Evaluation 11/04/21    Authorization Type MEDICARE PART A AND B    Authorization Time Period [employer]    Progress Note Due on Visit 10    PT Start Time 0716    PT Stop Time 0758    PT Time Calculation (min) 42 min    Activity Tolerance Patient tolerated treatment well    Behavior During Therapy WFL for tasks assessed/performed                 Past Medical History:  Diagnosis Date   Acute embolism and thrombosis of unspecified deep veins of unspecified lower extremity    Asymptomatic varicose veins of bilateral lower extremities    Chronic kidney disease, stage 3a    Chronic pain    Clotting disorder    hx clot in left leg/right leg   DJD (degenerative joint disease)    ED (erectile dysfunction) of organic origin    Elevated liver enzymes 07/21/2011   Environmental allergies    Essential hypertension    Generalized anxiety disorder    Headache 07/21/2011   Hematuria syndrome    Herniated lumbar disc without myelopathy 03/08/2020   Hypoglycemia due to type 2 diabetes mellitus    Incisional hernia, without obstruction or gangrene 02/10/2013   Insomnia    Low back pain    Major depressive disorder    Malignant tumor of prostate    Mild cognitive impairment with memory loss 08/19/2021   Mixed hyperlipidemia    Motion sickness    Normocytic anemia 07/21/2011   Pharyngitis    Polyneuropathy due to type 2 diabetes mellitus    Polyp of colon    Renal insufficiency 07/21/2011   Sciatica    Splenomegaly 07/21/2011   Stroke    2011 MRI - Tiny acute infarct anterior medial left temporal lobe   Type II diabetes mellitus    Umbilical hernia    Vitamin D deficiency    Past Surgical  History:  Procedure Laterality Date   bilateral inguinal heria repairs     cholecystectomy     CHOLECYSTECTOMY     COLONOSCOPY     EYE SURGERY Bilateral    cataracts removed   HERNIA REPAIR     LUMBAR LAMINECTOMY/ DECOMPRESSION WITH MET-RX Right 03/08/2020   Procedure: Right Lumbar Five Sacral OneExtraforaminal microdiscectomy;  Surgeon: Erline Levine, MD;  Location: [facility];  Service: Neurosurgery;  Laterality: Right;   PROSTATECTOMY     VARICOSE VEIN SURGERY Bilateral    laser - legs   Patient Active Problem List   Diagnosis Date Noted   Mild cognitive impairment with memory loss 08/19/2021   Acute embolism and thrombosis of unspecified deep veins of unspecified lower extremity    Generalized anxiety disorder    Chronic kidney disease, stage 3a    Chronic pain    ED (erectile dysfunction) of organic origin    Stroke    Essential hypertension    Hematuria syndrome    Mixed hyperlipidemia    Type II diabetes mellitus    Hypoglycemia due to type 2 diabetes mellitus    Insomnia    Low back pain  Major depressive disorder    Malignant tumor of prostate ([place])    Motion sickness    Pharyngitis    Polyneuropathy due to type 2 diabetes mellitus    Polyp of colon    Sciatica    Umbilical hernia    Asymptomatic varicose veins of bilateral lower extremities    Vitamin D deficiency    Herniated lumbar disc without myelopathy 03/08/2020   Incisional hernia, without obstruction or gangrene 02/10/2013   Headache 07/21/2011   Splenomegaly 07/21/2011   Elevated liver enzymes 07/21/2011   Normocytic anemia 07/21/2011   Renal insufficiency 07/21/2011     REFERRING PROVIDER: Kathalene Frames, MD   REFERRING DIAG: Diagnosis M54.2 (ICD-10-CM) - Cervicalgia M54.50 (ICD-10-CM) - Low back pain, unspecified   THERAPY DIAG:  Chronic midline low back pain without sciatica  Cramp and spasm  Muscle weakness (generalized)  PERTINENT HISTORY: cognitive impairments, memory and  learning  PRECAUTIONS: cog.  SUBJECTIVE: Overall, both the neck and back are doing better. Sleeping better. The dry needling was helpful. Pt reports completing his HEP.  PAIN:  Are you having pain? Yes: NPRS scale: 2/10 Pain location: low back  Pain description: achy ,sore  Aggravating factors: sitting, AM  Relieving factors: stretching   Are you having pain? Yes: NPRS scale: 3/10 Pain location: post cervical, skull and along each side  Pain description: tightness , achy Aggravating factors: AM  Relieving factors: as the day goes on it gets better      OBJECTIVE: (objective measures completed at initial evaluation unless otherwise dated)   DIAGNOSTIC FINDINGS:  Lumbar Xray 08/17/21 IMPRESSION: No recent fracture is seen in the lumbar spine. Lumbar spondylosis with bony spurs, disc space narrowing, facet hypertrophy, spinal stenosis and encroachment of neural foramina, especially severe from L3-S1 levels.    Cervical CT 05/10/21 IMPRESSION: 1. Negative CT head 2. Negative for facial fracture 3. Cervical spondylosis.  Negative for cervical fracture.   PATIENT SURVEYS:  FOTO done 09/26/21: 53%, goal 72%. 10/19/21= 69%   10/19/21 COGNITION:           Overall cognitive status: Within functional limits for tasks assessed                          SENSATION: WFL   MUSCLE LENGTH: Hamstrings: Right 38 deg; Left 36 deg Thomas test: Right TBA deg; Left TBA deg   POSTURE:  Decreased lordosis   PALPATION: Not TTP of the lumbar paraspinal    LUMBAR ROM:    Active  A/PROM  09/16/2021 AROM 10/10/21  Flexion Markedly limited, stiff   Extension Min limited , pressure   Right lateral flexion Mod limited, stiff   Left lateral flexion Mod limited stiff   Right rotation Mod limited, stiff   Left rotation Mod limited stiff    (Blank rows = not tested)   LUMBAR SPECIAL TESTS:  Straight leg raise test: Negative, Slump test: Negative, and SI Compression/distraction test: Positive  for compression, neg for distraction  Cervical AROM: Flexion 55 Extension 46  Rot Rt 37 Rot L 35 Rt lateral flexion 20 Lt lateral flexion  15   LE ROM:                       Grossly WNLs   LE MMT:                       4+ to 5/5 except for  hip abd and ext at 4-/5   FUNCTIONAL TESTS:  None- no significant functional limitations observed   GAIT: Distance walked: 200' in th clinic Assistive device utilized: None Level of assistance: Complete Independence Comments: WNLs  [facility] Adult PT Treatment:                                                DATE: 10/19/21 Therapeutic Exercise: Nustep L6, 6 mins UE/LE DNF x5 10" Upper trap stretch x2 30" Levator stretch x2 30" Cervical retraction 10x, 5"  Manual Therapy: STM to the upper trap bilat Suboccipital release UPAs mobs C2-C6 Contract/relax lateral flexion mobs  Trigger Point Dry Needling Treatment: Skilled palpation for TPs and taut muscles for the upper traps Pre-treatment instruction: Patient instructed on dry needling rationale, procedures, and possible side effects including pain during treatment (achy,cramping feeling), bruising, drop of blood, lightheadedness, nausea, sweating. Patient Consent Given: Yes Education handout provided: Yes Muscles treated: suboccipitals, upper cervical paraspinals  Needle size and number: .30x34m 2 needles Electrical stimulation performed: No Parameters: N/A Treatment response/outcome: Twitch response elicited and Palpable decrease in muscle tension Post-treatment instructions: Patient instructed to expect possible mild to moderate muscle soreness later today and/or tomorrow. Patient instructed in methods to reduce muscle soreness and to continue prescribed HEP. If patient was dry needled over the lung field, patient was instructed on signs and symptoms of pneumothorax and, however unlikely, to see immediate medical attention should they occur. Patient was also educated on signs and symptoms  of infection and to seek medical attention should they occur. Patient verbalized understanding of these instructions and education   [facility]                                                [facility]: 10[facility]21 Therapeutic Exercise: UBE Upper trap stretch x2 30" Levator stretch x2 30" Cervical retraction 10x, 5" Updated HEP Manual Therapy: STM to the upper trap bilat Suboccipital release UPAs mobs C2-C6 Contract/relax lateral flexion mobs  Trigger Point Dry Needling Treatment: Skilled palpation for TPs and taut muscles for the upper traps Pre-treatment instruction: Patient instructed on dry needling rationale, procedures, and possible side effects including pain during treatment (achy,cramping feeling), bruising, drop of blood, lightheadedness, nausea, sweating. Patient Consent Given: Yes Education handout provided: Yes Muscles treated: Upper traps  Needle size and number:  .30x354m 2 needles Electrical stimulation performed: No Parameters: N/A Treatment response/outcome: Twitch response elicited and Palpable decrease in muscle tension Post-treatment instructions: Patient instructed to expect possible mild to moderate muscle soreness later today and/or tomorrow. Patient instructed in methods to reduce muscle soreness and to continue prescribed HEP. If patient was dry needled over the lung field, patient was instructed on signs and symptoms of pneumothorax and, however unlikely, to see immediate medical attention should they occur. Patient was also educated on signs and symptoms of infection and to seek medical attention should they occur. Patient verbalized understanding of these instructions and education  [facility]  [facility]: [facility]21 Therapeutic Exercise: Elliptical 5 min level 10 ramp and level 1 resistance  Seated upper trap stretch each side , 30 sec x 2  Levator 30 sec x 2 , used hand and towel for  overpressure Corner stretch 30 sec x 3 , 30 sec  Quadruped bird dog x 5 each UE and LE simultaneous  Prone hip extension x 10 each  Prone upper back lift x 10 , used hands to assist  Sidelying book openings x 5 each side , mod cues  Bridging x 10   PATIENT EDUCATION:  Education details: HEP, core, spine mobility and posture, pilates v yoga.  Person educated: Patient Education method: Explanation, Demonstration, Tactile cues, Verbal cues, and Handouts Education comprehension: verbalized understanding, returned demonstration, verbal cues required, and tactile cues required     HOME EXERCISE PROGRAM:  Access Code: [email or web address] URL: [email or web address] Date: 10/12/2021 Prepared by: Gar Ponto  Exercises - Seated Hamstring Stretch  - 2 x daily - 7 x weekly - 1 sets - 3 reps - 30 hold - Supine Bridge  - 1 x daily - 7 x weekly - 2 sets - 10 reps - 5 hold - Supine Lower Trunk Rotation  - 1 x daily - 7 x weekly - 2 sets - 10 reps - 10 hold - Cat Cow to Child's Pose  - 1 x daily - 7 x weekly - 2 sets - 10 reps - 10 hold - Bird Dog  - 1 x daily - 7 x weekly - 2 sets - 10 reps - 5 hold - Upper Trapezius Stretch  - 1-2 x daily - 7 x weekly - 1 sets - 3-5 reps - 30 hold - Seated Levator Scapulae Stretch  - 1-2 x daily - 7 x weekly - 1 sets - 3-5 reps - 30 hold - Corner Pec Major Stretch  - 1-2 x daily - 7 x weekly - 1 sets - 5 reps - 30 hold - Sidelying Thoracic Rotation with Open Book  - 1-2 x daily - 7 x weekly - 1 sets - 3-5 reps - 30 hold - Seated Passive Cervical Retraction  - 6 x daily - 7 x weekly - 1 sets - 5 reps - 1 hold   ASSESSMENT:   CLINICAL IMPRESSION: Pt continues to report improvement of both his low back and neck pain which is reflexed with his improved FOTO score. Pt notes he is sleeping more comfortably. PT was continued to address neck pain and ROM with manual therapy, TPDN to the suboccipital and upper cervical musculature, and therex. Pt tolerated the PT  session without adverse effects. Pt is making appropriate progress and will continue to benefit from skilled PT.   OBJECTIVE IMPAIRMENTS decreased balance, decreased knowledge of condition, decreased ROM, decreased strength, impaired flexibility, and pain.    ACTIVITY LIMITATIONS driving, occupation, yard work, shopping, and sleep .    PERSONAL FACTORS Time since onset of injury/illness/exacerbation and 1-2 comorbidities: DJD, Hx of low abck surgery  are also affecting patient's functional outcome.      REHAB POTENTIAL: Good   CLINICAL DECISION MAKING: Stable/uncomplicated   EVALUATION COMPLEXITY: Low     GOALS:   SHORT TERM GOALS: Target date: 10/07/2021   Pt will be ind in an initial HEP Baseline: new Goal status: Met   2.  Pt will voice understanding of measures to assist in the reduction of his low back pain Baseline:  Goal status: met    LONG TERM GOALS: Target  date: 11/04/21   Pt will report an improved ability to sleep by 50% Baseline:  Goal status: INITIAL   2.  Pt will report a decrease in early AM LBP to 3/10 for improved QOL Baseline:7/10 Goal status: INITIAL   3.  Pt will report a decrease in daytime LBP to 2 or less for improved QOL Baseline:  Goal status: INITIAL   4.  Increase bilat hip abd and ext strength to 4+/5 for decreased low back strain Baseline: 4_ Goal status: INITIAL   5.  Increased bilat hamstring flexibility to 48d to minimize low back strain Baseline: see flow sheets Goal status: INITIAL   6.  Pt will be Ind in a final HEP to maintain achieved LOF Baseline: Started on eval Goal status: INITIAL   7.  Pt's trunk will demonstrate improved flexibility with pt making at least minimal gains with each motion Baseline: see flow sheets Goal status: INITIAL     PLAN: PT FREQUENCY: 2x/week   PT DURATION: 6 weeks   PLANNED INTERVENTIONS: Therapeutic exercises, Therapeutic activity, Neuromuscular re-education, Balance training, Gait  training, Patient/Family education, Aquatic Therapy, Dry Needling, Electrical stimulation, Spinal manipulation, Spinal mobilization, Cryotherapy, Moist heat, Taping, Traction, Ultrasound, Ionotophoresis 18m/ml Dexamethasone, and Manual therapy   PLAN FOR NEXT SESSION:  Progress there ex as indicated, Use of modalities, manual techniques, and TPDN as indicated. Possible focus to low back the next PT session with the past 2 completed to address his neck pain.    Nataleah Scioneaux MS, PT 10/19/21 1:32 PM

## 2021-10-19 ENCOUNTER — Ambulatory Visit: Payer: Medicare Other

## 2021-10-19 DIAGNOSIS — R252 Cramp and spasm: Secondary | ICD-10-CM

## 2021-10-19 DIAGNOSIS — M6281 Muscle weakness (generalized): Secondary | ICD-10-CM

## 2021-10-19 DIAGNOSIS — G8929 Other chronic pain: Secondary | ICD-10-CM

## 2021-10-19 DIAGNOSIS — M545 Low back pain, unspecified: Secondary | ICD-10-CM | POA: Diagnosis not present

## 2021-10-20 DIAGNOSIS — R413 Other amnesia: Secondary | ICD-10-CM | POA: Diagnosis not present

## 2021-10-20 DIAGNOSIS — I129 Hypertensive chronic kidney disease with stage 1 through stage 4 chronic kidney disease, or unspecified chronic kidney disease: Secondary | ICD-10-CM | POA: Diagnosis not present

## 2021-10-20 DIAGNOSIS — Z8546 Personal history of malignant neoplasm of prostate: Secondary | ICD-10-CM | POA: Diagnosis not present

## 2021-10-20 DIAGNOSIS — Z1331 Encounter for screening for depression: Secondary | ICD-10-CM | POA: Diagnosis not present

## 2021-10-20 DIAGNOSIS — E782 Mixed hyperlipidemia: Secondary | ICD-10-CM | POA: Diagnosis not present

## 2021-10-20 DIAGNOSIS — E559 Vitamin D deficiency, unspecified: Secondary | ICD-10-CM | POA: Diagnosis not present

## 2021-10-20 DIAGNOSIS — M545 Low back pain, unspecified: Secondary | ICD-10-CM | POA: Diagnosis not present

## 2021-10-20 DIAGNOSIS — G44201 Tension-type headache, unspecified, intractable: Secondary | ICD-10-CM | POA: Diagnosis not present

## 2021-10-20 DIAGNOSIS — Z0000 Encounter for general adult medical examination without abnormal findings: Secondary | ICD-10-CM | POA: Diagnosis not present

## 2021-10-20 DIAGNOSIS — N183 Chronic kidney disease, stage 3 unspecified: Secondary | ICD-10-CM | POA: Diagnosis not present

## 2021-10-20 DIAGNOSIS — E1142 Type 2 diabetes mellitus with diabetic polyneuropathy: Secondary | ICD-10-CM | POA: Diagnosis not present

## 2021-10-20 DIAGNOSIS — I679 Cerebrovascular disease, unspecified: Secondary | ICD-10-CM | POA: Diagnosis not present

## 2021-10-24 ENCOUNTER — Ambulatory Visit: Payer: Medicare Other | Admitting: Physical Therapy

## 2021-10-27 ENCOUNTER — Ambulatory Visit
Admission: RE | Admit: 2021-10-27 | Discharge: 2021-10-27 | Disposition: A | Discharge status: Home / Self Care | Payer: Medicare Other | Source: Ambulatory Visit | Attending: Internal Medicine | Admitting: Internal Medicine

## 2021-10-27 DIAGNOSIS — R9082 White matter disease, unspecified: Secondary | ICD-10-CM | POA: Diagnosis not present

## 2021-10-27 DIAGNOSIS — Z8673 Personal history of transient ischemic attack (TIA), and cerebral infarction without residual deficits: Secondary | ICD-10-CM

## 2021-10-31 ENCOUNTER — Ambulatory Visit: Payer: Medicare Other | Admitting: Physical Therapy

## 2021-11-16 ENCOUNTER — Ambulatory Visit: Payer: Medicare Other | Attending: Internal Medicine

## 2021-11-16 DIAGNOSIS — M6281 Muscle weakness (generalized): Secondary | ICD-10-CM | POA: Diagnosis not present

## 2021-11-16 DIAGNOSIS — R252 Cramp and spasm: Secondary | ICD-10-CM | POA: Diagnosis not present

## 2021-11-16 DIAGNOSIS — G8929 Other chronic pain: Secondary | ICD-10-CM | POA: Insufficient documentation

## 2021-11-16 DIAGNOSIS — M545 Low back pain, unspecified: Secondary | ICD-10-CM | POA: Insufficient documentation

## 2021-11-16 NOTE — Therapy (Signed)
OUTPATIENT PHYSICAL THERAPY TREATMENT NOTE/Discharge   Patient Name: Richard Torres MRN: [id number] DOB:04-15-1946, 76 y.o., male Today's Date: 11/17/2021   END OF SESSION:   PT End of Session - 11/16/21 1638     Visit Number 7    Number of Visits 13    Date for PT Re-Evaluation 11/04/21    Authorization Type MEDICARE PART A AND B    Authorization Time Period [employer]    Progress Note Due on Visit 10    PT Start Time 1635    PT Stop Time 1721    PT Time Calculation (min) 46 min    Activity Tolerance Patient tolerated treatment well    Behavior During Therapy WFL for tasks assessed/performed                  Past Medical History:  Diagnosis Date   Acute embolism and thrombosis of unspecified deep veins of unspecified lower extremity    Asymptomatic varicose veins of bilateral lower extremities    Chronic kidney disease, stage 3a    Chronic pain    Clotting disorder    hx clot in left leg/right leg   DJD (degenerative joint disease)    ED (erectile dysfunction) of organic origin    Elevated liver enzymes 07/21/2011   Environmental allergies    Essential hypertension    Generalized anxiety disorder    Headache 07/21/2011   Hematuria syndrome    Herniated lumbar disc without myelopathy 03/08/2020   Hypoglycemia due to type 2 diabetes mellitus    Incisional hernia, without obstruction or gangrene 02/10/2013   Insomnia    Low back pain    Major depressive disorder    Malignant tumor of prostate    Mild cognitive impairment with memory loss 08/19/2021   Mixed hyperlipidemia    Motion sickness    Normocytic anemia 07/21/2011   Pharyngitis    Polyneuropathy due to type 2 diabetes mellitus    Polyp of colon    Renal insufficiency 07/21/2011   Sciatica    Splenomegaly 07/21/2011   Stroke    2011 MRI - Tiny acute infarct anterior medial left temporal lobe   Type II diabetes mellitus    Umbilical hernia    Vitamin D deficiency    Past  Surgical History:  Procedure Laterality Date   bilateral inguinal heria repairs     cholecystectomy     CHOLECYSTECTOMY     COLONOSCOPY     EYE SURGERY Bilateral    cataracts removed   HERNIA REPAIR     LUMBAR LAMINECTOMY/ DECOMPRESSION WITH MET-RX Right 03/08/2020   Procedure: Right Lumbar Five Sacral OneExtraforaminal microdiscectomy;  Surgeon: Erline Levine, MD;  Location: [facility];  Service: Neurosurgery;  Laterality: Right;   PROSTATECTOMY     VARICOSE VEIN SURGERY Bilateral    laser - legs   Patient Active Problem List   Diagnosis Date Noted   Mild cognitive impairment with memory loss 08/19/2021   Acute embolism and thrombosis of unspecified deep veins of unspecified lower extremity    Generalized anxiety disorder    Chronic kidney disease, stage 3a    Chronic pain    ED (erectile dysfunction) of organic origin    Stroke    Essential hypertension    Hematuria syndrome    Mixed hyperlipidemia    Type II diabetes mellitus    Hypoglycemia due to type 2 diabetes mellitus    Insomnia    Low back pain  Major depressive disorder    Malignant tumor of prostate ([place])    Motion sickness    Pharyngitis    Polyneuropathy due to type 2 diabetes mellitus    Polyp of colon    Sciatica    Umbilical hernia    Asymptomatic varicose veins of bilateral lower extremities    Vitamin D deficiency    Herniated lumbar disc without myelopathy 03/08/2020   Incisional hernia, without obstruction or gangrene 02/10/2013   Headache 07/21/2011   Splenomegaly 07/21/2011   Elevated liver enzymes 07/21/2011   Normocytic anemia 07/21/2011   Renal insufficiency 07/21/2011     REFERRING PROVIDER: Kathalene Frames, MD   REFERRING DIAG: Diagnosis M54.2 (ICD-10-CM) - Cervicalgia M54.50 (ICD-10-CM) - Low back pain, unspecified   THERAPY DIAG:  Chronic midline low back pain without sciatica  Cramp and spasm  Muscle weakness (generalized)  PERTINENT HISTORY: cognitive impairments, memory  and learning  PRECAUTIONS: cog.  SUBJECTIVE: Pt reports he still has some aches and pains, but overall much better. The dry needling helped my neck. My neck is 75% better , my back 65%.    PAIN:  Are you having pain? Yes: NPRS scale: 3/10 Pain location: low back  Pain description: achy ,sore  Aggravating factors: sitting, AM  Relieving factors: stretching   Are you having pain? Yes: NPRS scale: 4/10 Pain location: post cervical, skull and along each side  Pain description: tightness , achy Aggravating factors: AM  Relieving factors: as the day goes on it gets better   OBJECTIVE: (objective measures completed at initial evaluation unless otherwise dated)   DIAGNOSTIC FINDINGS:  Lumbar Xray 08/17/21 IMPRESSION: No recent fracture is seen in the lumbar spine. Lumbar spondylosis with bony spurs, disc space narrowing, facet hypertrophy, spinal stenosis and encroachment of neural foramina, especially severe from L3-S1 levels.    Cervical CT 05/10/21 IMPRESSION: 1. Negative CT head 2. Negative for facial fracture 3. Cervical spondylosis.  Negative for cervical fracture.   PATIENT SURVEYS:  FOTO done 09/26/21: 53%, goal 72%. 10/19/21= 69%   10/19/21 COGNITION:           Overall cognitive status: Within functional limits for tasks assessed                          SENSATION: WFL   MUSCLE LENGTH: Hamstrings: Right 38 deg; Left 36 deg 11/16/21: R=51d; L 50d Thomas test: Right TBA deg; Left TBA deg   POSTURE:  Decreased lordosis   PALPATION: Not TTP of the lumbar paraspinal    LUMBAR ROM:    Active  A/PROM  09/16/2021 AROM 11/16/21  Flexion Markedly limited, stiff Mod limited  Extension Min limited , pressure Min limited  Right lateral flexion Mod limited, stiff Min limited  Left lateral flexion Mod limited stiff Min limited  Right rotation Mod limited, stiff Full  Left rotation Mod limited stiff Full   (Blank rows = not tested)   LUMBAR SPECIAL TESTS:  Straight leg  raise test: Negative, Slump test: Negative, and SI Compression/distraction test: Positive for compression, neg for distraction  Cervical AROM:      11/16/21 Flexion 55 Extension 46  Rot Rt 37                  63 Rot L 35                     58 Rt lateral flexion 20   34 Lt lateral  flexion  15   30   LE ROM:                       Grossly WNLs   LE MMT:                       4+ to 5/5 except for hip abd and ext at 4-/5. 11/16/21=bilat hip abd and ext 4+/5   FUNCTIONAL TESTS:  None- no significant functional limitations observed   GAIT: Distance walked: 200' in th clinic Assistive device utilized: None Level of assistance: Complete Independence Comments: WNLs  [facility] Adult PT Treatment:                                                DATE: 11/16/21 Therapeutic Exercise: Nu step 6 mins L6 UE LE Seated trunk flexion forward and lateral x4 15" Seated Hamstring stretch 2x each 20" LTR x5, 5" Bridging x20 Manual Therapy: STM to the suboccipital and upper cervical muscles bilat Suboccipital release UPAs mobs C2-C6 Skilled palpation for TPs and taut muscles for the upper traps  Trigger Point Dry Needling Treatment: Pre-treatment instruction: Patient instructed on dry needling rationale, procedures, and possible side effects including pain during treatment (achy,cramping feeling), bruising, drop of blood, lightheadedness, nausea, sweating. Patient Consent Given: Yes Education handout provided: Yes Muscles treated: suboccipitals, upper cervical paraspinals  Needle size and number: .30x85m 2 needles Electrical stimulation performed: No Parameters: N/A Treatment response/outcome: Twitch response elicited and Palpable decrease in muscle tension Post-treatment instructions: Patient instructed to expect possible mild to moderate muscle soreness later today and/or tomorrow. Patient instructed in methods to reduce muscle soreness and to continue prescribed HEP. If patient was dry needled over  the lung field, patient was instructed on signs and symptoms of pneumothorax and, however unlikely, to see immediate medical attention should they occur. Patient was also educated on signs and symptoms of infection and to seek medical attention should they occur. Patient verbalized understanding of these instructions and education  [facility]                                                DATE: 10/19/21 Therapeutic Exercise: Nustep L6, 6 mins UE/LE DNF x5 10" Upper trap stretch x2 30" Levator stretch x2 30" Cervical retraction 10x, 5"  Manual Therapy: STM to the upper trap bilat Suboccipital release UPAs mobs C2-C6 Contract/relax lateral flexion mobs  Trigger Point Dry Needling Treatment: Skilled palpation for TPs and taut muscles for the upper traps Pre-treatment instruction: Patient instructed on dry needling rationale, procedures, and possible side effects including pain during treatment (achy,cramping feeling), bruising, drop of blood, lightheadedness, nausea, sweating. Patient Consent Given: Yes Education handout provided: Yes Muscles treated: suboccipitals, upper cervical paraspinals  Needle size and number: .30x395m2 needles Electrical stimulation performed: No Parameters: N/A Treatment response/outcome: Twitch response elicited and Palpable decrease in muscle tension Post-treatment instructions: Patient instructed to expect possible mild to moderate muscle soreness later today and/or tomorrow. Patient instructed in methods to reduce muscle soreness and to continue prescribed HEP. If patient was dry needled over the lung field, patient was instructed on signs and symptoms of pneumothorax and, however unlikely,  to see immediate medical attention should they occur. Patient was also educated on signs and symptoms of infection and to seek medical attention should they occur. Patient verbalized understanding of these instructions and education   [facility] Adult PT Treatment:                                                 DATE: 10/12/21 Therapeutic Exercise: UBE Upper trap stretch x2 30" Levator stretch x2 30" Cervical retraction 10x, 5" Updated HEP Manual Therapy: STM to the upper trap bilat Suboccipital release UPAs mobs C2-C6 Contract/relax lateral flexion mobs  Trigger Point Dry Needling Treatment: Skilled palpation for TPs and taut muscles for the upper traps Pre-treatment instruction: Patient instructed on dry needling rationale, procedures, and possible side effects including pain during treatment (achy,cramping feeling), bruising, drop of blood, lightheadedness, nausea, sweating. Patient Consent Given: Yes Education handout provided: Yes Muscles treated: Upper traps  Needle size and number:  .30x23m  2 needles Electrical stimulation performed: No Parameters: N/A Treatment response/outcome: Twitch response elicited and Palpable decrease in muscle tension Post-treatment instructions: Patient instructed to expect possible mild to moderate muscle soreness later today and/or tomorrow. Patient instructed in methods to reduce muscle soreness and to continue prescribed HEP. If patient was dry needled over the lung field, patient was instructed on signs and symptoms of pneumothorax and, however unlikely, to see immediate medical attention should they occur. Patient was also educated on signs and symptoms of infection and to seek medical attention should they occur. Patient verbalized understanding of these instructions and education  PATIENT EDUCATION:  Education details: HEP, core, spine mobility and posture, pilates v yoga.  Person educated: Patient Education method: Explanation, Demonstration, Tactile cues, Verbal cues, and Handouts Education comprehension: verbalized understanding, returned demonstration, verbal cues required, and tactile cues required     HOME EXERCISE PROGRAM:  Access Code: [email or web address]: [email or web address] Date:  10/12/2021 Prepared by: AGar Ponto Exercises - Seated Hamstring Stretch  - 2 x daily - 7 x weekly - 1 sets - 3 reps - 30 hold - Supine Bridge  - 1 x daily - 7 x weekly - 2 sets - 10 reps - 5 hold - Supine Lower Trunk Rotation  - 1 x daily - 7 x weekly - 2 sets - 10 reps - 10 hold - Cat Cow to Child's Pose  - 1 x daily - 7 x weekly - 2 sets - 10 reps - 10 hold - Bird Dog  - 1 x daily - 7 x weekly - 2 sets - 10 reps - 5 hold - Upper Trapezius Stretch  - 1-2 x daily - 7 x weekly - 1 sets - 3-5 reps - 30 hold - Seated Levator Scapulae Stretch  - 1-2 x daily - 7 x weekly - 1 sets - 3-5 reps - 30 hold - Corner Pec Major Stretch  - 1-2 x daily - 7 x weekly - 1 sets - 5 reps - 30 hold - Sidelying Thoracic Rotation with Open Book  - 1-2 x daily - 7 x weekly - 1 sets - 3-5 reps - 30 hold - Seated Passive Cervical Retraction  - 6 x daily - 7 x weekly - 1 sets - 5 reps - 1 hold   ASSESSMENT:   CLINICAL IMPRESSION: Pt completed his last PT session today. Pt has made good  improvement ROM, strength, and with his level of pain. Pt has met the majority of his PT goals and is Ind in a HEP to maintain achieved LOF. Pt is in agreement with DC at this time.   OBJECTIVE IMPAIRMENTS decreased balance, decreased knowledge of condition, decreased ROM, decreased strength, impaired flexibility, and pain.    ACTIVITY LIMITATIONS driving, occupation, yard work, shopping, and sleep .    PERSONAL FACTORS Time since onset of injury/illness/exacerbation and 1-2 comorbidities: DJD, Hx of low abck surgery  are also affecting patient's functional outcome.      REHAB POTENTIAL: Good   CLINICAL DECISION MAKING: Stable/uncomplicated   EVALUATION COMPLEXITY: Low     GOALS:   SHORT TERM GOALS: Target date: 10/07/2021   Pt will be ind in an initial HEP Baseline: new Goal status: Met   2.  Pt will voice understanding of measures to assist in the reduction of his low back pain Baseline:  Goal status: met    LONG  TERM GOALS: Target date: 11/04/21   Pt will report an improved ability to sleep by 50% Baseline:  Status: 50% improve Goal status: MET   2.  Pt will report a decrease in early AM LBP to 3/10 for improved QOL 11/16/21: 6/10 Baseline:7/10 Goal status: Partailly met   3.  Pt will report a decrease in daytime LBP to 2 or less for improved QOL 11/12/26=3 or less Baseline:  Goal status: Partially met   4.  Increase bilat hip abd and ext strength to 4+/5 for decreased low back strain 11/16/21=4=/5 Baseline: 4_ Goal status: MET   5.  Increased bilat hamstring flexibility to 48d to minimize low back strain Baseline: see flow sheets Goal status: MET   6.  Pt will be Ind in a final HEP to maintain achieved LOF Baseline: Started on eval Goal status: MET   7.  Pt's trunk will demonstrate improved flexibility with pt making at least minimal gains with each motion Baseline: see flow sheets Goal status: MET     PLAN: PT FREQUENCY: 2x/week   PT DURATION: 6 weeks   PLANNED INTERVENTIONS: Therapeutic exercises, Therapeutic activity, Neuromuscular re-education, Balance training, Gait training, Patient/Family education, Aquatic Therapy, Dry Needling, Electrical stimulation, Spinal manipulation, Spinal mobilization, Cryotherapy, Moist heat, Taping, Traction, Ultrasound, Ionotophoresis 31m/ml Dexamethasone, and Manual therapy   PLAN FOR NEXT SESSION:    PHYSICAL THERAPY DISCHARGE SUMMARY  Visits from Start of Care: 7  Current functional level related to goals / functional outcomes: See clinical impression and PT goals   Remaining deficits: See clinical impression and PT goals   Education / Equipment: HEP, sleeping positions, and computer/desk posture   Patient agrees to discharge. Patient goals were Goals were met or improved. Patient is being discharged due to being pleased with the current functional level.    Javarius Tsosie MS, PT 11/17/21 6:00 AM

## 2021-11-28 ENCOUNTER — Encounter: Payer: Medicare Other | Admitting: Physical Therapy

## 2021-12-01 DIAGNOSIS — E782 Mixed hyperlipidemia: Secondary | ICD-10-CM | POA: Diagnosis not present

## 2021-12-01 DIAGNOSIS — R413 Other amnesia: Secondary | ICD-10-CM | POA: Diagnosis not present

## 2021-12-05 ENCOUNTER — Encounter: Payer: Medicare Other | Admitting: Physical Therapy

## 2022-02-22 DIAGNOSIS — Z23 Encounter for immunization: Secondary | ICD-10-CM | POA: Diagnosis not present

## 2022-05-31 DIAGNOSIS — I10 Essential (primary) hypertension: Secondary | ICD-10-CM | POA: Diagnosis not present

## 2022-05-31 DIAGNOSIS — E1142 Type 2 diabetes mellitus with diabetic polyneuropathy: Secondary | ICD-10-CM | POA: Diagnosis not present

## 2022-05-31 DIAGNOSIS — I679 Cerebrovascular disease, unspecified: Secondary | ICD-10-CM | POA: Diagnosis not present

## 2022-05-31 DIAGNOSIS — N183 Chronic kidney disease, stage 3 unspecified: Secondary | ICD-10-CM | POA: Diagnosis not present

## 2022-05-31 DIAGNOSIS — E538 Deficiency of other specified B group vitamins: Secondary | ICD-10-CM | POA: Diagnosis not present

## 2022-05-31 DIAGNOSIS — E782 Mixed hyperlipidemia: Secondary | ICD-10-CM | POA: Diagnosis not present

## 2022-05-31 DIAGNOSIS — Z8546 Personal history of malignant neoplasm of prostate: Secondary | ICD-10-CM | POA: Diagnosis not present

## 2022-05-31 DIAGNOSIS — R413 Other amnesia: Secondary | ICD-10-CM | POA: Diagnosis not present

## 2022-07-03 DIAGNOSIS — I8311 Varicose veins of right lower extremity with inflammation: Secondary | ICD-10-CM | POA: Diagnosis not present

## 2022-07-03 DIAGNOSIS — R600 Localized edema: Secondary | ICD-10-CM | POA: Diagnosis not present

## 2022-07-03 DIAGNOSIS — I8312 Varicose veins of left lower extremity with inflammation: Secondary | ICD-10-CM | POA: Diagnosis not present

## 2022-07-03 DIAGNOSIS — I83893 Varicose veins of bilateral lower extremities with other complications: Secondary | ICD-10-CM | POA: Diagnosis not present

## 2022-07-03 DIAGNOSIS — R252 Cramp and spasm: Secondary | ICD-10-CM | POA: Diagnosis not present

## 2022-07-18 DIAGNOSIS — I83892 Varicose veins of left lower extremities with other complications: Secondary | ICD-10-CM | POA: Diagnosis not present

## 2022-07-18 DIAGNOSIS — I83812 Varicose veins of left lower extremities with pain: Secondary | ICD-10-CM | POA: Diagnosis not present

## 2022-07-18 DIAGNOSIS — M7989 Other specified soft tissue disorders: Secondary | ICD-10-CM | POA: Diagnosis not present

## 2022-07-20 DIAGNOSIS — I83891 Varicose veins of right lower extremities with other complications: Secondary | ICD-10-CM | POA: Diagnosis not present

## 2022-07-20 DIAGNOSIS — I83811 Varicose veins of right lower extremities with pain: Secondary | ICD-10-CM | POA: Diagnosis not present

## 2022-08-02 DIAGNOSIS — M79605 Pain in left leg: Secondary | ICD-10-CM | POA: Diagnosis not present

## 2022-08-02 DIAGNOSIS — I83892 Varicose veins of left lower extremities with other complications: Secondary | ICD-10-CM | POA: Diagnosis not present

## 2022-08-03 DIAGNOSIS — I83891 Varicose veins of right lower extremities with other complications: Secondary | ICD-10-CM | POA: Diagnosis not present

## 2022-08-03 DIAGNOSIS — I83811 Varicose veins of right lower extremities with pain: Secondary | ICD-10-CM | POA: Diagnosis not present

## 2022-08-12 DIAGNOSIS — Z23 Encounter for immunization: Secondary | ICD-10-CM | POA: Diagnosis not present

## 2022-08-31 DIAGNOSIS — I83892 Varicose veins of left lower extremities with other complications: Secondary | ICD-10-CM | POA: Diagnosis not present

## 2022-08-31 DIAGNOSIS — I83812 Varicose veins of left lower extremities with pain: Secondary | ICD-10-CM | POA: Diagnosis not present

## 2022-09-01 DIAGNOSIS — I83891 Varicose veins of right lower extremities with other complications: Secondary | ICD-10-CM | POA: Diagnosis not present

## 2022-11-07 DIAGNOSIS — I83813 Varicose veins of bilateral lower extremities with pain: Secondary | ICD-10-CM | POA: Diagnosis not present

## 2022-11-07 DIAGNOSIS — I8311 Varicose veins of right lower extremity with inflammation: Secondary | ICD-10-CM | POA: Diagnosis not present

## 2022-11-07 DIAGNOSIS — I8312 Varicose veins of left lower extremity with inflammation: Secondary | ICD-10-CM | POA: Diagnosis not present

## 2022-11-07 DIAGNOSIS — L819 Disorder of pigmentation, unspecified: Secondary | ICD-10-CM | POA: Diagnosis not present

## 2022-11-07 DIAGNOSIS — R252 Cramp and spasm: Secondary | ICD-10-CM | POA: Diagnosis not present

## 2022-11-14 DIAGNOSIS — N183 Chronic kidney disease, stage 3 unspecified: Secondary | ICD-10-CM | POA: Diagnosis not present

## 2022-11-14 DIAGNOSIS — I10 Essential (primary) hypertension: Secondary | ICD-10-CM | POA: Diagnosis not present

## 2022-11-14 DIAGNOSIS — Z23 Encounter for immunization: Secondary | ICD-10-CM | POA: Diagnosis not present

## 2022-11-14 DIAGNOSIS — I679 Cerebrovascular disease, unspecified: Secondary | ICD-10-CM | POA: Diagnosis not present

## 2022-11-14 DIAGNOSIS — Z1331 Encounter for screening for depression: Secondary | ICD-10-CM | POA: Diagnosis not present

## 2022-11-14 DIAGNOSIS — E1142 Type 2 diabetes mellitus with diabetic polyneuropathy: Secondary | ICD-10-CM | POA: Diagnosis not present

## 2022-11-14 DIAGNOSIS — E538 Deficiency of other specified B group vitamins: Secondary | ICD-10-CM | POA: Diagnosis not present

## 2022-11-14 DIAGNOSIS — Z8546 Personal history of malignant neoplasm of prostate: Secondary | ICD-10-CM | POA: Diagnosis not present

## 2022-11-14 DIAGNOSIS — R413 Other amnesia: Secondary | ICD-10-CM | POA: Diagnosis not present

## 2022-11-14 DIAGNOSIS — E782 Mixed hyperlipidemia: Secondary | ICD-10-CM | POA: Diagnosis not present

## 2022-11-14 DIAGNOSIS — Z0000 Encounter for general adult medical examination without abnormal findings: Secondary | ICD-10-CM | POA: Diagnosis not present

## 2022-11-14 DIAGNOSIS — Z79899 Other long term (current) drug therapy: Secondary | ICD-10-CM | POA: Diagnosis not present

## 2023-01-24 DIAGNOSIS — Z23 Encounter for immunization: Secondary | ICD-10-CM | POA: Diagnosis not present

## 2023-03-13 DIAGNOSIS — R413 Other amnesia: Secondary | ICD-10-CM | POA: Diagnosis not present

## 2023-03-13 DIAGNOSIS — N183 Chronic kidney disease, stage 3 unspecified: Secondary | ICD-10-CM | POA: Diagnosis not present

## 2023-03-13 DIAGNOSIS — I679 Cerebrovascular disease, unspecified: Secondary | ICD-10-CM | POA: Diagnosis not present

## 2023-03-13 DIAGNOSIS — I10 Essential (primary) hypertension: Secondary | ICD-10-CM | POA: Diagnosis not present

## 2023-03-13 DIAGNOSIS — E1142 Type 2 diabetes mellitus with diabetic polyneuropathy: Secondary | ICD-10-CM | POA: Diagnosis not present

## 2023-03-14 ENCOUNTER — Telehealth: Payer: Self-pay | Admitting: Psychology

## 2023-03-14 NOTE — Telephone Encounter (Signed)
Patients wife was calling to make an appointment for him, she stated that she was told to make an appointment after 18months, if patient is showing change and patient is showing change. I was wondering if I needed to make an appointment with you with sara

## 2023-03-20 ENCOUNTER — Ambulatory Visit: Payer: Medicare Other | Admitting: Physician Assistant

## 2023-04-13 ENCOUNTER — Encounter: Payer: Self-pay | Admitting: Physician Assistant

## 2023-04-13 ENCOUNTER — Other Ambulatory Visit (INDEPENDENT_AMBULATORY_CARE_PROVIDER_SITE_OTHER): Payer: Medicare Other

## 2023-04-13 ENCOUNTER — Ambulatory Visit: Payer: Medicare Other | Admitting: Physician Assistant

## 2023-04-13 VITALS — BP 111/64 | HR 65 | Resp 18 | Wt 180.0 lb

## 2023-04-13 DIAGNOSIS — G3184 Mild cognitive impairment, so stated: Secondary | ICD-10-CM | POA: Diagnosis not present

## 2023-04-13 DIAGNOSIS — R413 Other amnesia: Secondary | ICD-10-CM

## 2023-04-13 LAB — VITAMIN B12: Vitamin B-12: 681 pg/mL (ref 211–911)

## 2023-04-13 LAB — TSH: TSH: 1.72 u[IU]/mL (ref 0.35–5.50)

## 2023-04-13 MED ORDER — MEMANTINE HCL 5 MG PO TABS: ORAL_TABLET | ORAL | 11 refills | Status: DC

## 2023-04-13 NOTE — Progress Notes (Signed)
Assessment/Plan:     Richard Torres is a very pleasant 77 y.o. year old RH male with a history of hypertension, hyperlipidemia, DM2 with neuropathy, and prior diagnosis of mild cognitive impairment of unclear etiology although Alzheimer's disease could not be ruled out (per neuropsych evaluation March 2023), history of RLE DVT, CKD, GAD,  remote anterior medial left temporal lobe tiny infarct (2011), vitamin D deficiency seen today for evaluation of memory loss. MoCA today is 26/30. Patient is able to participate on his ADLs and to drive and work part time as a Animator. Wife is concerned of undiagnosed adult ADD as well.   Workup is in progress.Mood is good.    Mild Cognitive Impairment with Memory Loss   MRI brain without contrast to assess for underlying structural abnormality and assess vascular load  Repeat neurocognitive testing to further evaluate cognitive concerns and determine other underlying cause of memory changes, including potential contribution from sleep, anxiety, attention, or depression among others, clarity of diagnosis and disease trajectory  Start Memantine 5 mg: Take 1 tablet (5 mg at night) for 2 weeks, then increase to 1 tablet (5 mg) twice a day, side effects discussed    Check B12, TSH Recommend good control of cardiovascular risk factors.   Continue to control mood as per PCP Folllow up in 2 months  Subjective:    The patient is accompanied by his wife who supplements the history.     How long did patient have memory difficulties?  For about 4-5 years  Patient reports some difficulty remembering new information, recent conversations, names. "I always was very good with numbers".  LTM is great. Has more difficulty with technology such as cells and remote control. His wife reports that there may be a component of ADD which was never diagnosed. HE reports that may have underlying dyslexia as well, although not sure. repeats oneself?  Endorsed,  especially with appointments. Disoriented when walking into a room?  Patient denies    Leaving objects in unusual places?  Denies.   Wandering behavior? Denies.   Any personality changes, or depression, anxiety? Denies  Hallucinations or paranoia? denies   Seizures? denies    Any sleep changes? Sleeps well since decreasing the work load.  Reports stressful vivid dreams at times, talks in his sleep, and moves frequently at night. Denies sleepwalking   Sleep apnea? Denies.   Any hygiene concerns?  Denies.   Independent of bathing and dressing? Endorsed  Does the patient need help with medications?  Wife is in charge, but wife states he can do it by himself.    Who is in charge of the finances?  Son is in charge who is a IT trainer     Any changes in appetite?   Denies. Eats well.     Patient have trouble swallowing?  Denies.   Does the patient cook? No.  Any headaches?  Denies.   Chronic pain?  Endorsed, especially at neck and back, but "eased up over the years after surgery for ruptured disk" Ambulates with difficulty? Denies, uses the treadmill, climbs steps to lower the sugars. Has a mini gym at the garage. Recent falls or head injuries?  In 2022 he fell on the concrete hitting his head, he denies any loss of consciousness at the time.    Vision changes?  Denies any new issues.  Has a history of color blindness Any strokelike symptoms? Denies.   Any tremors? Denies.  Any anosmia? He does have  decreased taste and smell  Any incontinence of urine? Denies.   Any bowel dysfunction? Chronic diarrhea issues       Patient lives with wife  History of heavy alcohol intake? Denies.   History of heavy tobacco use? Denies.   Family history of dementia?   Father with dementia but he had ALS, Brother FTD, sister with Pick's disease, mother had senile dementia died at 65   Does patient drive?  Yes, "but I have never been good with directions"  Litigating attorney., encounters more difficulty but  "everything changed, everything I faster".    Neuropsych evaluation 08/25/21 Briefly, results suggested significant impairment surrounding most aspects of verbal learning and memory. Some variability was seen across retention percentages; however, normative performances were exceptionally low across encoding (i.e., learning), retrieval, and recognition/consolidation aspects of verbal memory. Performance was appropriate across all other assessed cognitive domains relative to age-matched peers. The etiology of current verbal memory impairment is unclear at the present time. I cannot rule out the presence of Alzheimer's disease in very early stages. While he was not fully amnestic across verbal memory measures, he did exhibit impairments learning information, retrieving learned information after a delay, and recognizing information when it was re-presented to him in a yes/no format. This would suggest an evolving memory storage deficit, which is concerning for this illness. However, at the present time, visual learning and memory was intact, as were other non-memory abilities commonly affected early on in this disease process. As this illness remains a possibility, continued monitoring will be important, especially regarding any evidence for further cognitive or functional decline as time progresses.    MRI of the brain June 2023, personally reviewed showed periventricular white matter changes, mildly advanced for age, reflecting the sequela of chronic microvascular ischemia, no acute intracranial abnormalities.  Allergies  Allergen Reactions   Donepezil Hcl     Other Reaction(s): fatigue "felt like a zombie"   Shellfish Allergy Other (See Comments)    lightheaded    Current Outpatient Medications  Medication Instructions   acetaminophen (TYLENOL) 650 mg, Oral, Every 6 hours PRN   aspirin 162 mg, Oral, Daily   atorvastatin (LIPITOR) 20 mg, Oral, Every evening   cholecalciferol (VITAMIN D) 2,000 Units,  Oral, Every evening   fenofibrate (TRICOR) 145 mg, Oral, Every evening   glipiZIDE (GLUCOTROL XL) 2.5 mg, Oral, Daily   HYDROcodone-acetaminophen (NORCO/VICODIN) 5-325 MG tablet 1 tablet, Oral, Every 6 hours PRN   lidocaine (LIDODERM) 5 % 1 patch, Transdermal, Every 24 hours, Remove & Discard patch within 12 hours or as directed by MD   memantine (NAMENDA) 5 MG tablet Take 1 tablet (5 mg at night) for 2 weeks, then increase to 1 tablet (5 mg) twice a day   metFORMIN (GLUCOPHAGE-XR) 750 mg, Oral, 2 times daily   methylPREDNISolone (MEDROL DOSEPAK) 4 mg, Oral, Daily, Taper   metoprolol tartrate (LOPRESSOR) 25 mg, 2 times daily   sertraline (ZOLOFT) 100 mg, Oral, Every evening   telmisartan (MICARDIS) 40 mg, Oral, Daily     VITALS:   Vitals:   04/13/23 0909  BP: 111/64  Pulse: 65  Resp: 18  SpO2: 97%  Weight: 180 lb (81.6 kg)      PHYSICAL EXAM   HEENT:  Normocephalic, atraumatic.  The superficial temporal arteries are without ropiness or tenderness. Cardiovascular: Regular rate and rhythm. Lungs: Clear to auscultation bilaterally. Neck: There are no carotid bruits noted bilaterally.  NEUROLOGICAL:    04/13/2023   12:00 PM  [facility]  Cognitive Assessment   Visuospatial/ Executive (0/5) 5  Naming (0/3) 3  Attention: Read list of digits (0/2) 2  Attention: Read list of letters (0/1) 1  Attention: Serial 7 subtraction starting at 100 (0/3) 3  Language: Repeat phrase (0/2) 1  Language : Fluency (0/1) 1  Abstraction (0/2) 1  Delayed Recall (0/5) 3  Orientation (0/6) 6  Total 26  Adjusted Score (based on education) 26        No data to display           Orientation:  Alert and oriented to person, place and  time. No aphasia or dysarthria. Fund of knowledge is appropriate. Recent memory impaired, remote memory is normal.  Attention and concentration are reduced .  Able to name objects and repeat phrases . Delayed recall 3/5 Cranial nerves: There is good facial symmetry.  Extraocular muscles are intact and visual fields are full to confrontational testing. Speech is fluent and clear. No tongue deviation. Hearing is intact to conversational tone.  Tone: Tone is good throughout. Sensation: Sensation is intact to light touch.  Vibration is intact at the bilateral big toe.  Coordination: The patient has no difficulty with RAM's or FNF bilaterally. Normal finger to nose  Motor: Strength is 5/5 in the bilateral upper and lower extremities. There is no pronator drift. There are no fasciculations noted. DTR's: Deep tendon reflexes are 2/4 bilaterally. Gait and Station: The patient is able to ambulate without difficulty. Gait is cautious and narrow. Stride length is normal        Thank you for allowing [place] the opportunity to participate in the care of this nice patient. Please do not hesitate to contact us for any questions or concerns.   Total time spent on today's visit was 70 minutes dedicated to this patient today, preparing to see patient, examining the patient, ordering tests and/or medications and counseling the patient, documenting clinical information in the EHR or other health record, independently interpreting results and communicating results to the patient/family, discussing treatment and goals, answering patient's questions and coordinating care.  Cc:  Emilio Aspen, MD  Marlowe Kays 04/13/2023 12:10 PM

## 2023-04-13 NOTE — Progress Notes (Signed)
B12 and thyroid are normal, thanks

## 2023-04-13 NOTE — Patient Instructions (Addendum)

## 2023-04-18 ENCOUNTER — Ambulatory Visit
Admission: RE | Admit: 2023-04-18 | Discharge: 2023-04-18 | Disposition: A | Discharge status: Home / Self Care | Payer: Medicare Other | Source: Ambulatory Visit | Attending: Physician Assistant | Admitting: Physician Assistant

## 2023-04-18 DIAGNOSIS — G319 Degenerative disease of nervous system, unspecified: Secondary | ICD-10-CM | POA: Diagnosis not present

## 2023-04-18 DIAGNOSIS — R413 Other amnesia: Secondary | ICD-10-CM | POA: Diagnosis not present

## 2023-04-18 DIAGNOSIS — I6782 Cerebral ischemia: Secondary | ICD-10-CM | POA: Diagnosis not present

## 2023-04-22 NOTE — Progress Notes (Signed)
The MRI of the brain  showed mild hardening of the small blood vessels in the brain in patients with high cholesterol, blood pressure or sugar control issues, sometimes age as well. It did not show any tumors, stroke or bleed.Thanks

## 2023-04-23 ENCOUNTER — Telehealth: Payer: Self-pay | Admitting: Physician Assistant

## 2023-04-23 NOTE — Telephone Encounter (Signed)
Error, patient has already spoken with someone

## 2023-04-28 ENCOUNTER — Other Ambulatory Visit: Payer: Self-pay

## 2023-04-28 ENCOUNTER — Encounter (HOSPITAL_COMMUNITY): Payer: Self-pay | Admitting: *Deleted

## 2023-04-28 ENCOUNTER — Emergency Department (HOSPITAL_COMMUNITY)
Admission: EM | Admit: 2023-04-28 | Discharge: 2023-04-29 | Disposition: A | Discharge status: Home / Self Care | Payer: Medicare Other | Attending: Emergency Medicine | Admitting: Emergency Medicine

## 2023-04-28 DIAGNOSIS — Z7984 Long term (current) use of oral hypoglycemic drugs: Secondary | ICD-10-CM | POA: Insufficient documentation

## 2023-04-28 DIAGNOSIS — R110 Nausea: Secondary | ICD-10-CM | POA: Diagnosis not present

## 2023-04-28 DIAGNOSIS — R55 Syncope and collapse: Secondary | ICD-10-CM | POA: Diagnosis not present

## 2023-04-28 DIAGNOSIS — E1122 Type 2 diabetes mellitus with diabetic chronic kidney disease: Secondary | ICD-10-CM | POA: Insufficient documentation

## 2023-04-28 DIAGNOSIS — N289 Disorder of kidney and ureter, unspecified: Secondary | ICD-10-CM | POA: Insufficient documentation

## 2023-04-28 DIAGNOSIS — N189 Chronic kidney disease, unspecified: Secondary | ICD-10-CM | POA: Diagnosis not present

## 2023-04-28 DIAGNOSIS — I129 Hypertensive chronic kidney disease with stage 1 through stage 4 chronic kidney disease, or unspecified chronic kidney disease: Secondary | ICD-10-CM | POA: Insufficient documentation

## 2023-04-28 DIAGNOSIS — Z7982 Long term (current) use of aspirin: Secondary | ICD-10-CM | POA: Insufficient documentation

## 2023-04-28 DIAGNOSIS — Z79899 Other long term (current) drug therapy: Secondary | ICD-10-CM | POA: Diagnosis not present

## 2023-04-28 DIAGNOSIS — R42 Dizziness and giddiness: Secondary | ICD-10-CM | POA: Diagnosis not present

## 2023-04-28 LAB — CBC
HCT: 46.9 % (ref 39.0–52.0)
Hemoglobin: 15.5 g/dL (ref 13.0–17.0)
MCH: 30.5 pg (ref 26.0–34.0)
MCHC: 33 g/dL (ref 30.0–36.0)
MCV: 92.3 fL (ref 80.0–100.0)
Platelets: 265 10*3/uL (ref 150–400)
RBC: 5.08 MIL/uL (ref 4.22–5.81)
RDW: 12.5 % (ref 11.5–15.5)
WBC: 8.9 10*3/uL (ref 4.0–10.5)
nRBC: 0 % (ref 0.0–0.2)

## 2023-04-28 LAB — BASIC METABOLIC PANEL
Anion gap: 12 (ref 5–15)
BUN: 35 mg/dL — ABNORMAL HIGH (ref 8–23)
CO2: 19 mmol/L — ABNORMAL LOW (ref 22–32)
Calcium: 9.4 mg/dL (ref 8.9–10.3)
Chloride: 106 mmol/L (ref 98–111)
Creatinine, Ser: 1.46 mg/dL — ABNORMAL HIGH (ref 0.61–1.24)
GFR, Estimated: 50 mL/min — ABNORMAL LOW (ref >60)
Glucose, Bld: 174 mg/dL — ABNORMAL HIGH (ref 70–99)
Potassium: 4.6 mmol/L (ref 3.5–5.1)
Sodium: 137 mmol/L (ref 135–145)

## 2023-04-28 LAB — TROPONIN I (HIGH SENSITIVITY): Troponin I (High Sensitivity): 4 ng/L (ref <18)

## 2023-04-28 LAB — CBG MONITORING, ED: Glucose-Capillary: 174 mg/dL — ABNORMAL HIGH (ref 70–99)

## 2023-04-28 LAB — MAGNESIUM: Magnesium: 2 mg/dL (ref 1.7–2.4)

## 2023-04-28 MED ORDER — SODIUM CHLORIDE 0.9 % IV BOLUS
1000.0000 mL | Freq: Once | INTRAVENOUS | Status: AC
  Administered 2023-04-29: 1000 mL via INTRAVENOUS

## 2023-04-28 NOTE — ED Provider Triage Note (Signed)
Emergency Medicine Provider Triage Evaluation Note  Richard Torres [facility] , a 77 y.o. male  was evaluated in triage.  Pt complains of syncope.  Review of Systems  Positive: Lightheadedness, syncope, nausea Negative: Chest pain, shortness of breath  Physical Exam  BP 120/75 (BP Location: Left Arm)   Pulse 65   Temp 97.8 F (36.6 C) (Oral)   Resp 16   Ht 6\' 1"  (1.854 m)   Wt 81.6 kg   SpO2 99%   BMI 23.75 kg/m  Gen:   Awake, no distress   Resp:  Normal effort  MSK:   Moves extremities without difficulty   Medical Decision Making  Medically screening exam initiated at 9:04 PM.  Appropriate orders placed.  Richard Torres was informed that the remainder of the evaluation will be completed by another provider, this initial triage assessment does not replace that evaluation, and the importance of remaining in the ED until their evaluation is complete.  Patient with history of syncopal episode secondary to standing for prolonged periods of time, presents for syncopal episode that occurred tonight.  At the time, he was at a Christmas party and he was standing.  He had a prodrome of dizziness and lightheadedness.  He had a brief LOC.  He had some nausea which resolved following Zofran with EMS.  He continues to endorse some mild lightheadedness.   Gloris Manchester, MD 04/28/23 2106

## 2023-04-28 NOTE — ED Triage Notes (Signed)
Pt was at a get-together. Began feeling dizzy, was lowered to the ground.  Woke up 1 min later with "people starting at him".  Neuro intact.  Pt was nauseated when ems arrived.  Given 4mg  of zofran.  1/2 beer prior to even.    CBG 206 Bp 112/82 Hr 68 96% RA  PT ao x 4.  Equal grips.  Pt states hx of dizziness where he becomes dizzy and near syncopal when he stands for long periods of time, which he was doing when he was at this party.

## 2023-04-29 DIAGNOSIS — R55 Syncope and collapse: Secondary | ICD-10-CM | POA: Diagnosis not present

## 2023-04-29 LAB — URINALYSIS, ROUTINE W REFLEX MICROSCOPIC
Bilirubin Urine: NEGATIVE
Glucose, UA: NEGATIVE mg/dL
Hgb urine dipstick: NEGATIVE
Ketones, ur: NEGATIVE mg/dL
Leukocytes,Ua: NEGATIVE
Nitrite: NEGATIVE
Protein, ur: NEGATIVE mg/dL
Specific Gravity, Urine: 1.026 (ref 1.005–1.030)
pH: 5 (ref 5.0–8.0)

## 2023-04-29 LAB — TROPONIN I (HIGH SENSITIVITY): Troponin I (High Sensitivity): 5 ng/L (ref <18)

## 2023-04-29 NOTE — Discharge Instructions (Signed)
Whenever you feel light-headed, lie down.  Make sure you are drinking enough fluids.

## 2023-04-29 NOTE — ED Provider Notes (Signed)
[facility] EMERGENCY DEPARTMENT AT [facility] Provider Note   CSN: [id number] Arrival date & time: 04/28/23  2036     History  Chief Complaint  Patient presents with   Loss of Consciousness    Richard Torres is a 77 y.o. male.  The history is provided by the patient.  Loss of Consciousness He has history of hypertension, diabetes, hyperlipidemia, stroke, chronic kidney disease and comes in following a syncopal episode.  He had been standing at a party and started to get lightheaded.  He tried to sit down but apparently did have a syncopal episode.  He was assisted to the floor and had relatively brief loss of consciousness.  He denies chest pain, heaviness, tightness, pressure.  He denies any palpitations.  There is no witnessed seizure activity, no loss of bowel or bladder control, no bit lip or tongue.  He has had similar episodes in the past when he has been standing for significant length of time.  He does admit that he had not been drinking much fluid during the course of the day.   Home Medications Prior to Admission medications   Medication Sig Start Date End Date Taking? Authorizing Provider  acetaminophen (TYLENOL) 325 MG tablet Take 650 mg by mouth every 6 (six) hours as needed for mild pain or moderate pain.     [provider]  aspirin 81 MG tablet Take 162 mg by mouth daily.     [provider]  atorvastatin (LIPITOR) 20 MG tablet Take 20 mg by mouth every evening.     [provider]  cholecalciferol (VITAMIN D) 1000 UNITS tablet Take 2,000 Units by mouth every evening.     [provider]  fenofibrate (TRICOR) 145 MG tablet Take 145 mg by mouth every evening.     [provider]  glipiZIDE (GLUCOTROL XL) 2.5 MG 24 hr tablet Take 2.5 mg by mouth daily. 01/01/20   [provider]  HYDROcodone-acetaminophen (NORCO/VICODIN) 5-325 MG tablet Take 1 tablet by mouth every 6 (six) hours as needed for moderate  pain.    [provider]  lidocaine (LIDODERM) 5 % Place 1 patch onto the skin daily. Remove & Discard patch within 12 hours or as directed by MD 05/10/21   Henderly, Britni A, PA-C  memantine (NAMENDA) 5 MG tablet Take 1 tablet (5 mg at night) for 2 weeks, then increase to 1 tablet (5 mg) twice a day 04/13/23   Marcos Eke, PA-C  metFORMIN (GLUCOPHAGE-XR) 750 MG 24 hr tablet Take 750 mg by mouth 2 (two) times daily. 02/16/20   [provider]  methylPREDNISolone (MEDROL DOSEPAK) 4 MG TBPK tablet Take 4 mg by mouth daily. Taper 03/03/20   [provider]  metoprolol tartrate (LOPRESSOR) 25 MG tablet Take 25 mg by mouth 2 (two) times daily.    [provider]  sertraline (ZOLOFT) 100 MG tablet Take 100 mg by mouth every evening.     [provider]  telmisartan (MICARDIS) 40 MG tablet Take 40 mg by mouth daily.     [provider]      Allergies    Donepezil hcl and Shellfish allergy    Review of Systems   Review of Systems  Cardiovascular:  Positive for syncope.  All other systems reviewed and are negative.   Physical Exam Updated Vital Signs BP 119/68 (BP Location: Left Arm)   Pulse 61   Temp 97.6 F (36.4 C)   Resp 18  Ht 6\' 1"  (1.854 m)   Wt 81.6 kg   SpO2 100%   BMI 23.75 kg/m  Physical Exam Vitals and nursing note reviewed.   77 year old male, resting comfortably and in no acute distress. Vital signs are normal. Oxygen saturation is 100%, which is normal. Head is normocephalic and atraumatic. PERRLA, EOMI. Oropharynx is clear. Neck is nontender and supple without adenopathy or JVD. Lungs are clear without rales, wheezes, or rhonchi. Chest is nontender. Heart has regular rate and rhythm without murmur. Abdomen is soft, flat, nontender. Extremities have no cyanosis or edema, full range of motion is present. Skin is warm and dry without rash. Neurologic: Mental status is normal, cranial nerves are intact, moves all  extremities equally.  ED Results / Procedures / Treatments   Labs (all labs ordered are listed, but only abnormal results are displayed) Labs Reviewed  BASIC METABOLIC PANEL - Abnormal; Notable for the following components:      Result Value   CO2 19 (*)    Glucose, Bld 174 (*)    BUN 35 (*)    Creatinine, Ser 1.46 (*)    GFR, Estimated 50 (*)    All other components within normal limits  URINALYSIS, ROUTINE W REFLEX MICROSCOPIC - Abnormal; Notable for the following components:   APPearance CLOUDY (*)    All other components within normal limits  CBG MONITORING, ED - Abnormal; Notable for the following components:   Glucose-Capillary 174 (*)    All other components within normal limits  CBC  MAGNESIUM  CBG MONITORING, ED  TROPONIN I (HIGH SENSITIVITY)  TROPONIN I (HIGH SENSITIVITY)    EKG EKG Interpretation Date/Time:  Saturday April 28 2023 20:47:47 EST Ventricular Rate:  65 PR Interval:  178 QRS Duration:  82 QT Interval:  412 QTC Calculation: 428 R Axis:   -4  Text Interpretation: Normal sinus rhythm Confirmed by Gloris Manchester ([id number]) on 04/28/2023 9:06:17 PM  Procedures Procedures    Medications Ordered in ED Medications  sodium chloride 0.9 % bolus 1,000 mL (1,000 mLs Intravenous New Bag/Given 04/29/23 0233)    ED Course/ Medical Decision Making/ A&P                                 Medical Decision Making Amount and/or Complexity of Data Reviewed Labs: ordered.   Syncopal episode in patient with history of same.  No red flags to suggest serious pathology such as ACS, pulmonary embolism, tachyarrhythmias, bradycardia arrhythmias.  I have reviewed his electrocardiogram, my interpretation is normal ECG and unchanged from prior.  I have reviewed his laboratory tests, my interpretation is stable renal insufficiency although BUN is increased slightly compared with 03/08/2020 which could indicate some degree of dehydration, normal CBC, normal troponin x 2.  IV  fluids were ordered prior to my seeing him.  Following this, we will check orthostatic vital signs and make sure he can ambulate prior to discharge.  Orthostatic vital signs show no significant drop in blood pressure or rise in pulse. He was able to ambulate without difficulty. He is safe for discharge.   Final Clinical Impression(s) / ED Diagnoses Final diagnoses:  Syncope and collapse  Renal insufficiency    Rx / DC Orders ED Discharge Orders     None         Dione Booze, MD 04/29/23 [phone]

## 2023-04-29 NOTE — ED Notes (Signed)
Patient ambulates independently with a steady gait, good balance, and no reported discomfort. VSS

## 2023-05-01 DIAGNOSIS — R55 Syncope and collapse: Secondary | ICD-10-CM | POA: Diagnosis not present

## 2023-05-09 DIAGNOSIS — E782 Mixed hyperlipidemia: Secondary | ICD-10-CM | POA: Diagnosis not present

## 2023-05-09 DIAGNOSIS — Z8546 Personal history of malignant neoplasm of prostate: Secondary | ICD-10-CM | POA: Diagnosis not present

## 2023-05-09 DIAGNOSIS — I679 Cerebrovascular disease, unspecified: Secondary | ICD-10-CM | POA: Diagnosis not present

## 2023-05-09 DIAGNOSIS — Z23 Encounter for immunization: Secondary | ICD-10-CM | POA: Diagnosis not present

## 2023-05-09 DIAGNOSIS — E1142 Type 2 diabetes mellitus with diabetic polyneuropathy: Secondary | ICD-10-CM | POA: Diagnosis not present

## 2023-05-09 DIAGNOSIS — N183 Chronic kidney disease, stage 3 unspecified: Secondary | ICD-10-CM | POA: Diagnosis not present

## 2023-05-09 DIAGNOSIS — E538 Deficiency of other specified B group vitamins: Secondary | ICD-10-CM | POA: Diagnosis not present

## 2023-05-09 DIAGNOSIS — I10 Essential (primary) hypertension: Secondary | ICD-10-CM | POA: Diagnosis not present

## 2023-05-09 DIAGNOSIS — K529 Noninfective gastroenteritis and colitis, unspecified: Secondary | ICD-10-CM | POA: Diagnosis not present

## 2023-05-09 DIAGNOSIS — R413 Other amnesia: Secondary | ICD-10-CM | POA: Diagnosis not present

## 2023-06-05 ENCOUNTER — Encounter: Payer: Self-pay | Admitting: Psychology

## 2023-06-05 ENCOUNTER — Ambulatory Visit: Payer: Medicare Other | Admitting: Psychology

## 2023-06-05 ENCOUNTER — Ambulatory Visit: Payer: Medicare Other

## 2023-06-05 DIAGNOSIS — R4189 Other symptoms and signs involving cognitive functions and awareness: Secondary | ICD-10-CM

## 2023-06-05 DIAGNOSIS — G3184 Mild cognitive impairment, so stated: Secondary | ICD-10-CM | POA: Diagnosis not present

## 2023-06-05 DIAGNOSIS — I6381 Other cerebral infarction due to occlusion or stenosis of small artery: Secondary | ICD-10-CM

## 2023-06-05 NOTE — Progress Notes (Signed)
   Psychometrician Note   Cognitive testing was administered to [employer] by Lonell Jude, B.S. (psychometrist) under the supervision of Dr. Arthea KYM Maryland, Ph.D., licensed psychologist on 06/05/2023. Mr. Butcher did not appear overtly distressed by the testing session per behavioral observation or responses across self-report questionnaires. Rest breaks were offered.    The battery of tests administered was selected by Dr. Arthea KYM Maryland, Ph.D. with consideration to Mr. Flud's current level of functioning, the nature of his symptoms, emotional and behavioral responses during interview, level of literacy, observed level of motivation/effort, and the nature of the referral question. This battery was communicated to the psychometrist. Communication between Dr. Arthea KYM Maryland, Ph.D. and the psychometrist was ongoing throughout the evaluation and Dr. Arthea KYM Maryland, Ph.D. was immediately accessible at all times. Dr. Arthea KYM Maryland, Ph.D. provided supervision to the psychometrist on the date of this service to the extent necessary to assure the quality of all services provided.    [employer] [employer] S[employer] will return within approximately 1-2 weeks for an interactive feedback session with Dr. Maryland at which time his test performances, clinical impressions, and treatment recommendations will be reviewed in detail. Mr. Weigold understands he can contact our office should he require our assistance before this time.  A total of 145 minutes of billable time were spent face-to-face with Mr. Sanz by the psychometrist. This includes both test administration and scoring time. Billing for these services is reflected in the clinical report generated by Dr. Arthea KYM Maryland, Ph.D.  This note reflects time spent with the psychometrician and does not include test scores or any clinical interpretations made by Dr. Maryland. The full report will follow in a separate note.

## 2023-06-05 NOTE — Progress Notes (Signed)
 NEUROPSYCHOLOGICAL EVALUATION [place]. [facility] [place] Department of Neurology  Date of Evaluation: June 05, 2023  Reason for Referral:   Ozro Russett is a 78 y.o. right-handed Caucasian male referred by Camie Sevin, PA-C, to characterize his current cognitive functioning and assist with diagnostic clarity and treatment planning in the context of a prior mild neurocognitive disorder diagnosis and concerns for progressive cognitive decline.   Assessment and Plan:   Clinical Impression(s): Mr. Liberatore pattern of performance is suggestive of significant impairment surrounding most aspects of learning and memory. Some variability was seen across retention percentages; however, normative performances were quite poor across encoding (i.e., learning), retrieval, and recognition/consolidation aspects of memory given premorbid intellectual estimations. An isolated impairment was also exhibited across a working memory task, while variability was exhibited across executive functioning. Performances were appropriate relative to age-matched peers across processing speed, basic attention/concentration, safety/judgment, receptive and expressive language, and visuospatial abilities. Functionally, Mr. Zelek largely denied concerns. His wife noted him needing occasional reminders to take morning medications, as well as one isolated instance of him getting turned around and briefly lost while driving. Diagnostically, I feel that he continues to best meet criteria for a Mild Neurocognitive Disorder (mild cognitive impairment) at the present time.  Relative to his previous evaluation in March 2023, performance declines were exhibited across working memory and an museum/gallery exhibitions officer task Water Quality Scientist Fluency). Notably increased repetition errors were also exhibited across the latter task. While normative decline was exhibited across a figure copy task,  current performances were still normatively appropriate. Specific to memory, there was some intra-domain variability. While one could make the argument for very subtle improvements across story and daily living tasks, the argument could also be made for greater decline across visual memory. All other assessed domains exhibited stability across the two evaluations.  The cause for ongoing memory dysfunction continues to be unclear. It is worth highlighting that neuroimaging in 2011 did suggest a small infarct involving the left medial temporal lobe. Damage to the left medial temporal lobe can certainly negatively influence memory processes. However, the timeframe of memory dysfunction continues to not align particularly well as Mr. Loftin and his wife denied their observation of memory deficits after this event and reported concern for memory decline only over the past few years. I continue to be unable to rule out the very early beginnings of a neurodegenerative illness such as Alzheimer's disease. However, memory as whole did not exhibit consistent decline and other non-memory areas commonly implicated in this illness (e.g., semantic fluency, confrontation naming) were strong and stable across both evaluations.    Specific to ADHD, the absence of cognitive deficits should not be interpreted as absence of this condition as there is no pattern of performance across cognitive testing that is specific to ADHD. Individuals with ADHD can perform strongly in testing environments, likely due to the highly structured and distraction free setting in which testing commences. With that being said, patterns across testing did not suggest widespread attention dysregulation. For example, performance across a continuous performance test designed to assist with ADHD detection suggested some trouble with sustained attention but overall concluded that results do not suggest that [Mr. Belflower] has a disorder characterized by  attention deficits, such as ADHD. While Mr. Beal may have underlying traits of ADHD, I continue to be unable to point to objective cognitive data that would warrant a formal diagnosis.   Recommendations: A repeat neuropsychological evaluation in 18-24 months (or sooner  if functional decline is noted) is recommended to assess the trajectory of future cognitive decline should it occur. This will also aid in future efforts towards improved diagnostic clarity.  Mr. Filippi has already been prescribed a medication aimed to address memory loss and concerns surrounding progressive cognitive decline (i.e., memantine /Namenda ). He is encouraged to continue taking this medication as prescribed. It is important to highlight that this medication has been shown to slow functional decline in some individuals. There is no current treatment which can stop or reverse cognitive decline when caused by a neurodegenerative illness.   Performance across neurocognitive testing is not a strong predictor of an individual's safety operating a motor vehicle. Should his family wish to pursue a formalized driving evaluation, they could reach out to the following agencies: [employer] in [place]: [phone] [facility]: [phone] [facility]: [phone] [facility]: [phone] or [phone]  Should there be progression of current deficits over time, Mr. Lopata is unlikely to regain any independent living skills lost. Therefore, it is recommended that he remain as involved as possible in all aspects of household chores, finances, and medication management, with supervision to ensure adequate performance. He will likely benefit from the establishment and maintenance of a routine in order to maximize his functional abilities over time.  It will be important for Mr. Vachon to have another person with him when in situations where he may need to process  information, weigh the pros and cons of different options, and make decisions, in order to ensure that he fully understands and recalls all information to be considered.  If not already done, Mr. Derner and his family may want to discuss his wishes regarding durable power of attorney and medical decision making, so that he can have input into these choices. If they require legal assistance with this, long-term care resource access, or other aspects of estate planning, they could reach out to [employer] at [phone] for a free consultation.   Mr. Tavano is encouraged to attend to lifestyle factors for brain health (e.g., regular physical exercise, good nutrition habits and consideration of the MIND-DASH diet, regular participation in cognitively-stimulating activities, and general stress management techniques), which are likely to have benefits for both emotional adjustment and cognition. Optimal control of vascular risk factors (including safe cardiovascular exercise and adherence to dietary recommendations) is encouraged. Continued participation in activities which provide mental stimulation and social interaction is also recommended.   Memory can be improved using internal strategies such as rehearsal, repetition, chunking, mnemonics, association, and imagery. External strategies such as written notes in a consistently used memory journal, visual and nonverbal auditory cues such as a calendar on the refrigerator or appointments with alarm, such as on a cell phone, can also help maximize recall.    When learning new information, he would benefit from information being broken up into small, manageable pieces. He may also find it helpful to articulate the material in his own words and in a context to promote encoding at the onset of a new task. This material may need to be repeated multiple times to promote encoding. Because he shows better recall for structured information, he will likely  understand and retain new information better if it is presented to him in a meaningful or well-organized manner at the outset, such as grouping items into meaningful categories or presenting information in an outlined, bulleted, or story format.  To address problems with fluctuating attention and/or executive dysfunction, he may wish to consider:   -  Avoiding external distractions when needing to concentrate   -Limiting exposure to fast paced environments with multiple sensory demands   -Writing down complicated information and using checklists   -Attempting and completing one task at a time (i.e., no multi-tasking)   -Verbalizing aloud each step of a task to maintain focus   -Taking frequent breaks during the completion of steps/tasks to avoid fatigue   -Reducing the amount of information considered at one time   -Scheduling more difficult activities for a time of day where he is usually most alert  Review of Records:   Mr. Corrales contacted his PCP Beverlie Bolder, M.D.) on 11/08/2020 expressing concerns surrounding short-term memory dysfunction. Dr. Bolder noted that he had also spoken with Mr. Gupton's wife while they were last in the office and she also expressed similar concerns. Dr. Bolder questioned if some memory concerns were related to underlying depressive symptoms. Mr. Recker was most recently seen by his PCP on 03/17/2021. Performance on a brief cognitive screening instrument (MMSE) was reportedly 30/30. Ultimately, Mr. Santee was referred for a comprehensive neuropsychological evaluation to characterize his cognitive abilities and to assist with diagnostic clarity and treatment planning.   He completed a comprehensive neuropsychological evaluation with myself on 08/19/2021. Results suggested significant impairment surrounding most aspects of verbal learning and memory. Some variability was seen across retention percentages; however, normative performances were exceptionally low  across encoding (i.e., learning), retrieval, and recognition/consolidation aspects of verbal memory. Performance was appropriate across all other assessed cognitive domains relative to age-matched peers. This includes processing speed, attention/concentration, executive functioning, safety/judgment, receptive and expressive language, visuospatial abilities, and visual learning and memory. ADL dysfunction was denied and he was diagnosed with a mild neurocognitive disorder. The underlying etiology for memory dysfunction was unclear at that time. He was referred to neurology and repeat testing in 18-24 months was recommended.  He was seen by [facility] Jayson Sevin, PA-C) on 04/13/2023 for an evaluation of memory loss. At that time, he described memory difficulties surrounding trouble remembering new information, including names and details of recent conversations. His wife also noted increased difficulty with technology such as operating his phone or remote controls. Difficulties were said to be present for the past 4-5 years. 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 Performance on a brief cognitive screening instrument (MOCA) was 26/30. Ultimately, Mr. Curet was referred for a repeat neuropsychological evaluation to characterize his cognitive abilities and to assist with diagnostic clarity and treatment planning.   Neuroimaging: Brain MRI on 02/05/2010 revealed a tiny acute infarct located in the left anterior medial temporal lobe, minimal microvascular ischemic changes, and very mild atrophy. Head CT on 05/10/2021 in the context of a fall was negative. Brain MRI on 10/29/2021 suggested mildly advanced periventricular microvascular ischemic disease, said to have progressed relative to his 2011 scan. Brain MRI on 04/21/2023 was stable. Neither of his two most recent MRIs mentioned his previously documented small infarct.  Past Medical History:  Diagnosis Date   Acute  embolism and thrombosis of unspecified deep veins of unspecified lower extremity    Asymptomatic varicose veins of bilateral lower extremities    Chronic kidney disease, stage 3a    Chronic pain    Clotting disorder    hx clot in left leg/right leg   DJD (degenerative joint disease)    ED (erectile dysfunction) of organic origin    Elevated liver enzymes 07/21/2011   Environmental allergies    Essential hypertension  Generalized anxiety disorder    Headache 07/21/2011   Hematuria syndrome    Herniated lumbar disc without myelopathy 03/08/2020   Hypoglycemia due to type 2 diabetes mellitus    Incisional hernia, without obstruction or gangrene 02/10/2013   Insomnia    Lacunar infarction 2011   2011 MRI - Tiny acute infarct anterior medial left temporal lobe     Low back pain    Major depressive disorder    Malignant tumor of prostate    Mild cognitive impairment with memory loss 08/19/2021   Mixed hyperlipidemia    Motion sickness    Normocytic anemia 07/21/2011   Pharyngitis    Polyneuropathy due to type 2 diabetes mellitus    Polyp of colon    Renal insufficiency 07/21/2011   Sciatica    Splenomegaly 07/21/2011   Type II diabetes mellitus    Umbilical hernia    Vitamin D  deficiency     Past Surgical History:  Procedure Laterality Date   bilateral inguinal heria repairs     cholecystectomy     CHOLECYSTECTOMY     COLONOSCOPY     EYE SURGERY Bilateral    cataracts removed   HERNIA REPAIR     LUMBAR LAMINECTOMY/ DECOMPRESSION WITH MET-RX Right 03/08/2020   Procedure: Right Lumbar Five Sacral OneExtraforaminal microdiscectomy;  Surgeon: Unice Pac, MD;  Location[facility] OR;  Service: Neurosurgery;  Laterality: Right;   PROSTATECTOMY     VARICOSE VEIN SURGERY Bilateral    laser - legs    Current Outpatient Medications:    acetaminophen  (TYLENOL ) 325 MG tablet, Take 650 mg by mouth every 6 (six) hours as needed for mild pain or moderate pain. , Disp: , Rfl:    aspirin   81 MG tablet, Take 162 mg by mouth daily. , Disp: , Rfl:    atorvastatin  (LIPITOR) 20 MG tablet, Take 20 mg by mouth every evening. , Disp: , Rfl:    cholecalciferol (VITAMIN D ) 1000 UNITS tablet, Take 2,000 Units by mouth every evening. , Disp: , Rfl:    fenofibrate  (TRICOR ) 145 MG tablet, Take 145 mg by mouth every evening. , Disp: , Rfl:    glipiZIDE  (GLUCOTROL  XL) 2.5 MG 24 hr tablet, Take 2.5 mg by mouth daily., Disp: , Rfl:    HYDROcodone -acetaminophen  (NORCO/VICODIN) 5-325 MG tablet, Take 1 tablet by mouth every 6 (six) hours as needed for moderate pain., Disp: , Rfl:    lidocaine  (LIDODERM ) 5 %, Place 1 patch onto the skin daily. Remove & Discard patch within 12 hours or as directed by MD, Disp: 30 patch, Rfl: 0   memantine  (NAMENDA ) 5 MG tablet, Take 1 tablet (5 mg at night) for 2 weeks, then increase to 1 tablet (5 mg) twice a day, Disp: 60 tablet, Rfl: 11   metFORMIN  (GLUCOPHAGE -XR) 750 MG 24 hr tablet, Take 750 mg by mouth 2 (two) times daily., Disp: , Rfl:    methylPREDNISolone  (MEDROL  DOSEPAK) 4 MG TBPK tablet, Take 4 mg by mouth daily. Taper, Disp: , Rfl:    metoprolol  tartrate (LOPRESSOR ) 25 MG tablet, Take 25 mg by mouth 2 (two) times daily., Disp: , Rfl:    sertraline  (ZOLOFT ) 100 MG tablet, Take 100 mg by mouth every evening. , Disp: , Rfl:    telmisartan (MICARDIS) 40 MG tablet, Take 40 mg by mouth daily. , Disp: , Rfl:   Clinical Interview:   The following information was obtained during a clinical interview with Mr. Firkus and his wife prior to cognitive testing.  Cognitive Symptoms: Decreased short-term memory: Endorsed. He previously described generalized short-term memory dysfunction. His wife noted that he had been asking repetitive questions more often lately. These seemed to focus on him confirming various dates or upcoming events/appointments. She noted that he may also have some trouble recalling details of previous conversations. At that time, difficulties were  said to have been present for a few years and there were concerns that they had been gradually worsening. Currently, similar concerns were expressed. Mr. Petion denied his perception of worsening while his wife reported some mild concerns.   Decreased long-term memory: Denied. Decreased attention/concentration: Endorsed. He previously reported trouble organizing his thoughts as efficiently as he had been able to in the past. His wife reported her belief that he has had traits of ADHD throughout his entire life which have gone undiagnosed. His wife again raised ADHD concerns today, highlighting that Mr. Kilbourne has exhibited trouble with sustained focus, seems mentally scattered, and has been easily overwhelmed throughout the duration of their lengthy marriage. She felt that these difficulties had mildly worsened relative to his previous evaluation.  Reduced processing speed: Denied. Rather, he described an overactive mind with constantly circling arguments. Difficulties with executive functions: Endorsed. They previously reported some longstanding trouble with multi-tasking and organization, but not to the point where symptoms had been functionally impairing. No significant personality changes were noted. His wife described slight changes in that he has seemed less patient with technology (e.g., computer, cell phone). Currently, his wife reported more advanced difficulties with technology, especially utilizing his cell phone and GPS.  Difficulties with emotion regulation: Denied. Difficulties with receptive language: Denied. Difficulties with word finding: Denied. Decreased visuoperceptual ability: Denied.   Trajectory of deficits: As stated above, his wife reported concerns for the past several years. They both denied any appreciable cognitive decline following his very small stroke in 2011.   Difficulties completing ADLs: Somewhat. His wife will provide reminders for him to take his morning  medications. She noted that he remembers to take these more times than not but has been having some increased difficulties. She also described a single instance where he took a wrong turn and got lost while driving and then had trouble operating the GPS on his phone.   Additional Medical History: History of traumatic brain injury/concussion: Unclear. He fell in December 2022, ultimately hitting his head on the concrete ground. He denied a loss in consciousness or a period of feeling dazed/disoriented afterwards. He was seen in the ED and his head CT was negative. He and his wife were unsure if he was formally diagnosed with a concussion due to this fall. No other potential head injuries were reported.  History of stroke: Endorsed (see above).  History of seizure activity: Denied. History of known exposure to toxins: Denied. Symptoms of chronic pain: Denied. He had previously described neck and back pain, exacerbated by his December 2022 fall.  Experience of frequent headaches/migraines: Endorsed. Symptoms seem to arise from neck pain. Headache symptoms were said to be present at a near daily frequency and treated with over-the-counter medications.  Frequent instances of dizziness/vertigo: Denied.   Sensory changes: Denied. He reported a longstanding history of a diminished sense of taste and smell but no acute changes.  Balance/coordination difficulties: Denied. Other motor difficulties: Denied.  Sleep History: Estimated hours obtained each night: 5-6 hours.  Difficulties falling asleep: Denied. Difficulties staying asleep: Endorsed. He reported waking to use the restroom, as well as instances where he wakes for unknown reasons.  Feels rested and refreshed upon awakening: Denied.   History of snoring: Denied. History of waking up gasping for air: Denied. Witnessed breath cessation while asleep: Denied.   History of vivid dreaming: Endorsed. He noted that vivid dreams are often distressing  in nature (e.g., being chased).  Excessive movement while asleep: Endorsed. He has engaged in lifelong hitting/kicking behaviors while asleep, attributed to dream content. Physical action while sleeping was said to occur sporadically, with symptoms sometimes occurring months apart from one another. His wife reported her current perception that these instances have been decreasing in frequency.  Instances of acting out his dreams: Denied outside what is described above.   Psychiatric/Behavioral Health History: Depression: He described his current mood as good and denied to his knowledge any current mental health concerns or formal diagnoses. Current or remote suicidal ideation, intent, or plan was denied.  Anxiety: Denied. Mania: Denied. Trauma History: Denied. Visual/auditory hallucinations: Denied. Delusional thoughts: Denied.  Tobacco: Denied. Alcohol: He denied current alcohol consumption as well as a history of problematic alcohol abuse or dependence.  Recreational drugs: Denied.  Family History: Problem Relation Age of Onset   Memory loss Mother        onset in mid 71s   Dementia Father    ALS Father    Cancer Brother        prostate   Cancer Brother        prostate   This information was confirmed by Mr. Whitehurst.  Academic/Vocational History: Highest level of educational attainment: 19 years. 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 No relative weaknesses were identified.  History of developmental delay: Denied. History of grade repetition: Denied. Enrollment in special education courses: Denied. History of LD/ADHD: Denied.   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 He is no longer taking new clients/cases and has been trending towards retirement. He did not report cognitive decline creating work-related performance concerns or any colleagues expressing concerns surrounding recent work quality.    Evaluation Results:   Behavioral Observations: Mr. Helms was accompanied by his wife, arrived to his appointment on time, and was appropriately dressed and groomed. He appeared alert. Observed gait and station were within normal limits. Gross motor functioning appeared intact upon informal observation and no abnormal movements (e.g., tremors) were noted. His affect was generally relaxed and positive. Spontaneous speech was fluent and word finding difficulties were not observed during the clinical interview. Thought processes were coherent, organized, and normal in content. Insight into his cognitive difficulties appeared somewhat limited. More specifically, he may not fully appreciate the extent of ongoing memory impairment.   During testing, sustained attention was appropriate. Task engagement was adequate and he persisted when challenged. Overall, Mr. Pinkus was cooperative with the clinical interview and subsequent testing procedures.   Adequacy of Effort: The validity of neuropsychological testing is limited by the extent to which the individual being tested may be assumed to have exerted adequate effort during testing. Mr. Lenderman expressed his intention to perform to the best of his abilities and exhibited adequate task engagement and persistence. Scores across stand-alone and embedded performance validity measures were within expectation. As such, the results of the current evaluation are believed to be a valid representation of Mr. Costabile's current cognitive functioning.  Test Results: Mr. Primo was largely oriented at the time of the current evaluation. He was unable to state the current date (mid January) and was 63 minutes off when estimating the  current time.   Intellectual abilities based upon educational and vocational attainment were estimated to be in the above average range. Premorbid abilities were estimated to be within the well above average range based upon a  single-word reading test.   Processing speed was mildly variable but overall appropriate, ranging from the below average to exceptionally high normative ranges. Basic attention was average. Performance across a continuous performance task suggested some trouble with sustained attention but that results did not suggest attentional deficits consistent with ADHD. Working memory was exceptionally low. Executive functioning was variable, ranging from the well below average to above average normative ranges. Performance was above average across a task assessing safety and judgment.   While not directly assessed, receptive language abilities were believed to be intact. Likewise, Mr. Demeo did not exhibit any difficulties comprehending task instructions and answered all questions asked of him appropriately. Assessed expressive language (e.g., verbal fluency and confrontation naming) was average to above average.     Assessed visuospatial/visuoconstructional abilities were average to above average.    Learning (i.e., encoding) of novel verbal and visual information was well below average to below average. Spontaneous delayed recall (i.e., retrieval) of previously learned information was exceptionally low to well below average. Retention rates were 0% across a list learning task, 88% across a story learning task, 43% across a daily living task, and 60% across a shape learning task. Performance across recognition tasks was variable, ranging from the well below average to average normative ranges, suggesting some limited evidence for information consolidation.   Results of emotional screening instruments suggested that recent symptoms of generalized anxiety were in the minimal range, while symptoms of depression were within the mild range. A screening instrument assessing recent sleep quality suggested the presence of mild sleep dysfunction.  Tables of Scores:   Note: This summary of test scores accompanies  the interpretive report and should not be considered in isolation without reference to the appropriate sections in the text. Descriptors are based on appropriate normative data and may be adjusted based on clinical judgment. Terms such as Within Normal Limits and Outside Normal Limits are used when a more specific description of the test score cannot be determined. Descriptors refer to the current evaluation only.        Percentile - Normative Descriptor > 98 - Exceptionally High 91-97 - Well Above Average 75-90 - Above Average 25-74 - Average 9-24 - Below Average 2-8 - Well Below Average < 2 - Exceptionally Low        Validity: March 2023 Current  DESCRIPTOR        DCT: --- --- --- Within Normal Limits  NAB EVI: --- --- --- Within Normal Limits        Orientation:       Raw Score Raw Score Percentile   NAB Orientation, Form 1 29/29 26/29 --- ---        Cognitive Screening:       Raw Score Raw Score Percentile   SLUMS: 25/30 25/30 --- ---        Intellectual Functioning:       Standard Score Standard Score Percentile   Test of Premorbid Functioning: 119 121 92 Well Above Average        Memory:      NAB Memory Module, Form 1: Standard Score/ T Score Standard Score/ T Score Percentile   Total Memory Index 64 67 1 Exceptionally Low  List Learning        Total Trials 1-3  14/36 (33) 15/36 (36) 8 Well Below Average    List B 5/12 (55) 3/12 (42) 21 Below Average    Short Delay Free Recall 1/12 (21) 2/12 (27) 1 Exceptionally Low    Long Delay Free Recall 1/12 (25) 0/12 (24) <1 Exceptionally Low    Retention Percentage 100 (52) 0 (24) <1 Exceptionally Low    Recognition Discriminability -3 (21) 0 (33) 5 Well Below Average  Shape Learning        Total Trials 1-3 13/27 (44) 11/27 (39) 14 Below Average    Delayed Recall 5/9 (46) 3/9 (35) 7 Well Below Average    Retention Percentage 100 (50) 60 (38) 12 Below Average    Recognition Discriminability 6 (47) 5 (44) 27 Average  Story  Learning        Immediate Recall 28/80 (26) 48/80 (39) 14 Below Average    Delayed Recall 15/40 (31) 22/40 (36) 8 Well Below Average    Retention Percentage 94 (50) 88 (51) 54 Average  Daily Living Memory        Immediate Recall 34/51 (36) 35/51 (39) 14 Below Average    Delayed Recall 3/17 (19) 6/17 (24) <1 Exceptionally Low    Retention Percentage 21 (<19) 43 (21) <1 Exceptionally Low    Recognition Hits 4/10 (<19) 6/10 (34) 5 Well Below Average        Attention/Executive Function:      Trail Making Test (TMT): Raw Score (T Score) Raw Score (T Score) Percentile     Part A 44 secs.,  0 errors (41) 45 secs.,  0 errors (39) 14 Below Average    Part B 81 secs.,  0 errors (48) 74 secs.,  1 error (56) 73 Average          Scaled Score Scaled Score Percentile   WAIS-IV Coding: 10 14 91 Well Above Average        NAB Attention Module, Form 1: T Score T Score Percentile     Digits Forward 58 54 66 Average    Digits Backwards 45 26 1 Exceptionally Low        Conners CPT 3: T Score T Score Percentile     d' --- 47 38 Average    Omissions --- 46 34 Average    Commissions --- 45 31 Average    Perseverations --- 44 27 Low    HRT --- 47 38 Average    HRT SD --- 47 38 Average    Variability --- 48 42 Average         Scaled Score Scaled Score Percentile   WAIS-IV Similarities: 12 13 84 Above Average        D-KEFS Verbal Fluency Test: Raw Score (Scaled Score) Raw Score (Scaled Score) Percentile     Letter Total Correct 51 (15) 56 (16) 98 Exceptionally High    Category Total Correct 36 (11) 48 (16) 98 Exceptionally High    Category Switching Total Correct 12 (10) 9 (6) 9 Below Average    Category Switching Accuracy 11 (10) 6 (5) 5 Well Below Average      Total Set Loss Errors 0 (13) 3 (9) 37 Average      Total Repetition Errors 6 (7) 14 (1) <1 Exceptionally Low        NAB Executive Functions Module, Form 1: T Score T Score Percentile     Judgment 58 58 79 Above Average         Language:  Verbal Fluency Test: Raw Score (T Score) Raw Score (T Score) Percentile     Phonemic Fluency (FAS) 51 (57) 56 (61) 86 Above Average    Animal Fluency 19 (49) 20 (50) 50 Average         NAB Language Module, Form 1: T Score T Score Percentile     Naming 30/31 (56) 30/31 (58) 79 Above Average        Visuospatial/Visuoconstruction:       Raw Score Raw Score Percentile   Clock Drawing: 10/10 9/10 --- Within Normal Limits        NAB Spatial Module, Form 1: T Score T Score Percentile     Figure Drawing Copy 70 47 38 Average         Scaled Score Scaled Score Percentile   WAIS-IV Block Design: 11 12 75 Above Average        Mood and Personality:       Raw Score Raw Score Percentile   Beck Depression Inventory - II: 13 15 --- Mild  PROMIS Anxiety Questionnaire: 9 14 --- None to Slight        Additional Questionnaires:       Raw Score Raw Score Percentile   PROMIS Sleep Disturbance Questionnaire: 25 28 --- Mild   Informed Consent and Coding/Compliance:   The current evaluation represents a clinical evaluation for the purposes previously outlined by the referral source and is in no way reflective of a forensic evaluation.   Mr. Schwertner was provided with a verbal description of the nature and purpose of the present neuropsychological evaluation. Also reviewed were the foreseeable risks and/or discomforts and benefits of the procedure, limits of confidentiality, and mandatory reporting requirements of this provider. The patient was given the opportunity to ask questions and receive answers about the evaluation. Oral consent to participate was provided by the patient.   This evaluation was conducted by Arthea KYM Maryland, Ph.D., ABPP-CN, board certified clinical neuropsychologist. Mr. Dahlstrom completed a clinical interview with Dr. Maryland, billed as one unit [phone], and 145 minutes of cognitive testing and scoring, billed as one unit [phone] and four additional units [id number].  Psychometrist Lonell Jude, B.S. assisted Dr. Maryland with test administration and scoring procedures. As a separate and discrete service, one unit [id number] and two units [phone] were billed for Dr. Loralee time spent in interpretation and report writing.

## 2023-06-12 ENCOUNTER — Encounter: Payer: Medicare Other | Admitting: Psychology

## 2023-06-13 ENCOUNTER — Ambulatory Visit: Payer: Medicare Other | Admitting: Physician Assistant

## 2023-06-21 ENCOUNTER — Ambulatory Visit: Payer: Medicare Other | Admitting: Psychology

## 2023-06-21 DIAGNOSIS — I6381 Other cerebral infarction due to occlusion or stenosis of small artery: Secondary | ICD-10-CM | POA: Diagnosis not present

## 2023-06-21 DIAGNOSIS — G3184 Mild cognitive impairment, so stated: Secondary | ICD-10-CM

## 2023-06-21 NOTE — Progress Notes (Signed)
   Neuropsychology Feedback Session Eligha Bridegroom. [facility] [place] Department of Neurology  Reason for Referral:   Auron Tadros is a 78 y.o. right-handed Caucasian male referred by Marlowe Kays, PA-C, to characterize his current cognitive functioning and assist with diagnostic clarity and treatment planning in the context of a prior mild neurocognitive disorder diagnosis and concerns for progressive cognitive decline.   Feedback:   Mr. Heiland completed a comprehensive neuropsychological evaluation on 06/05/2023. Please refer to that encounter for the full report and recommendations. Briefly, results suggested significant impairment surrounding most aspects of learning and memory. Some variability was seen across retention percentages; however, normative performances were quite poor across encoding (i.e., learning), retrieval, and recognition/consolidation aspects of memory given premorbid intellectual estimations. An isolated impairment was also exhibited across a working memory task, while variability was exhibited across executive functioning.  significant impairment surrounding most aspects of learning and memory. Some variability was seen across retention percentages; however, normative performances were quite poor across encoding (i.e., learning), retrieval, and recognition/consolidation aspects of memory given premorbid intellectual estimations. An isolated impairment was also exhibited across a working memory task, while variability was exhibited across executive functioning. I continue to be unable to rule out the very early beginnings of a neurodegenerative illness such as Alzheimer's disease. However, memory as whole did not exhibit consistent decline and other non-memory areas commonly implicated in this illness (e.g., semantic fluency, confrontation naming) were strong and stable across both evaluations.    Mr. Besancon was accompanied by his wife during the current  feedback session. Content of the current session focused on the results of his neuropsychological evaluation. Mr. Kohlmann was given the opportunity to ask questions and his questions were answered. He was encouraged to reach out should additional questions arise. A copy of his report was provided at the conclusion of the visit.      One unit [phone] was billed for Dr. Tammy Sours time spent preparing for, conducting, and documenting the current feedback session with Mr. Hijazi.

## 2023-07-02 ENCOUNTER — Encounter: Payer: Self-pay | Admitting: Physician Assistant

## 2023-07-02 ENCOUNTER — Ambulatory Visit: Payer: Medicare Other | Admitting: Physician Assistant

## 2023-07-02 ENCOUNTER — Other Ambulatory Visit: Payer: Medicare Other

## 2023-07-02 VITALS — BP 128/67 | HR 75 | Resp 20 | Wt 180.0 lb

## 2023-07-02 DIAGNOSIS — R413 Other amnesia: Secondary | ICD-10-CM | POA: Diagnosis not present

## 2023-07-02 DIAGNOSIS — G3184 Mild cognitive impairment, so stated: Secondary | ICD-10-CM

## 2023-07-02 NOTE — Patient Instructions (Addendum)
 It was a pleasure to see you today at our office.   Recommendations:    Check your hearing  Follow up in 3 months Blood tests today [address]  Continue memantine   5 mg daily     For psychiatric meds, mood meds: Please have your primary care physician manage these medications.  If you have any severe symptoms of a stroke, or other severe issues such as confusion,severe chills or fever, etc call 911 or go to the ER as you may need to be evaluated further     For assessment of decision of mental capacity and competency:  Call Dr. Laverne Potter, geriatric psychiatrist at [phone]  Counseling regarding caregiver distress, including caregiver depression, anxiety and issues regarding community resources, adult day care programs, adult living facilities, or memory care questions:  please contact your  Primary Doctor's Social Worker   Whom to call: Memory  decline, memory medications: Call our office [phone]    [email or web address]   RECOMMENDATIONS FOR ALL PATIENTS WITH MEMORY PROBLEMS: 1. Continue to exercise (Recommend 30 minutes of walking everyday, or 3 hours every week) 2. Increase social interactions - continue going to [place] and enjoy social gatherings with friends and family 3. Eat healthy, avoid fried foods and eat more fruits and vegetables 4. Maintain adequate blood pressure, blood sugar, and blood cholesterol level. Reducing the risk of stroke and cardiovascular disease also helps promoting better memory. 5. Avoid stressful situations. Live a simple life and avoid aggravations. Organize your time and prepare for the next day in anticipation. 6. Sleep well, avoid any interruptions of sleep and avoid any distractions in the bedroom that may interfere with adequate sleep quality 7. Avoid sugar, avoid sweets as there is a strong link between excessive sugar intake, diabetes, and cognitive impairment We discussed the  Mediterranean diet, which has been shown to help patients reduce the risk of progressive memory disorders and reduces cardiovascular risk. This includes eating fish, eat fruits and green leafy vegetables, nuts like almonds and hazelnuts, walnuts, and also use olive oil. Avoid fast foods and fried foods as much as possible. Avoid sweets and sugar as sugar use has been linked to worsening of memory function.  There is always a concern of gradual progression of memory problems. If this is the case, then we may need to adjust level of care according to patient needs. Support, both to the patient and caregiver, should then be put into place.        DRIVING: Regarding driving, in patients with progressive memory problems, driving will be impaired. We advise to have someone else do the driving if trouble finding directions or if minor accidents are reported. Independent driving assessment is available to determine safety of driving.   If you are interested in the driving assessment, you can contact the following:  [facility] [employer] [facility] in [place] [phone]  [facility] [phone]  [facility] [phone]  [facility] [phone] or [phone]   FALL PRECAUTIONS: Be cautious when walking. Scan the area for obstacles that may increase the risk of trips and falls. When getting up in the mornings, sit up at the edge of the bed for a few minutes before getting out of bed. Consider elevating the bed at the head end to avoid drop of blood pressure when getting up. Walk always in a well-lit room (use night lights in the walls). Avoid area rugs or power cords from appliances in the middle of the walkways. Use a walker or  a cane if necessary and consider physical therapy for balance exercise. Get your eyesight checked regularly.  FINANCIAL OVERSIGHT: Supervision, especially oversight when making financial decisions or transactions is also recommended.  HOME  SAFETY: Consider the safety of the kitchen when operating appliances like stoves, microwave oven, and blender. Consider having supervision and share cooking responsibilities until no longer able to participate in those. Accidents with firearms and other hazards in the house should be identified and addressed as well.   ABILITY TO BE LEFT ALONE: If patient is unable to contact 911 operator, consider using LifeLine, or when the need is there, arrange for someone to stay with patients. Smoking is a fire hazard, consider supervision or cessation. Risk of wandering should be assessed by caregiver and if detected at any point, supervision and safe proof recommendations should be instituted.  MEDICATION SUPERVISION: Inability to self-administer medication needs to be constantly addressed. Implement a mechanism to ensure safe administration of the medications.      Mediterranean Diet A Mediterranean diet refers to food and lifestyle choices that are based on the traditions of countries located on the [employer]. This way of eating has been shown to help prevent certain conditions and improve outcomes for people who have chronic diseases, like kidney disease and heart disease. What are tips for following this plan? Lifestyle  Cook and eat meals together with your family, when possible. Drink enough fluid to keep your urine clear or pale yellow. Be physically active every day. This includes: Aerobic exercise like running or swimming. Leisure activities like gardening, walking, or housework. Get 7-8 hours of sleep each night. If recommended by your health care provider, drink red wine in moderation. This means 1 glass a day for nonpregnant women and 2 glasses a day for men. A glass of wine equals 5 oz (150 mL). Reading food labels  Check the serving size of packaged foods. For foods such as rice and pasta, the serving size refers to the amount of cooked product, not dry. Check the total fat in  packaged foods. Avoid foods that have saturated fat or trans fats. Check the ingredients list for added sugars, such as corn syrup. Shopping  At the grocery store, buy most of your food from the areas near the walls of the store. This includes: Fresh fruits and vegetables (produce). Grains, beans, nuts, and seeds. Some of these may be available in unpackaged forms or large amounts (in bulk). Fresh seafood. Poultry and eggs. Low-fat dairy products. Buy whole ingredients instead of prepackaged foods. Buy fresh fruits and vegetables in-season from local farmers markets. Buy frozen fruits and vegetables in resealable bags. If you do not have access to quality fresh seafood, buy precooked frozen shrimp or canned fish, such as tuna, salmon, or sardines. Buy small amounts of raw or cooked vegetables, salads, or olives from the deli or salad bar at your store. Stock your pantry so you always have certain foods on hand, such as olive oil, canned tuna, canned tomatoes, rice, pasta, and beans. Cooking  Cook foods with extra-virgin olive oil instead of using butter or other vegetable oils. Have meat as a side dish, and have vegetables or grains as your main dish. This means having meat in small portions or adding small amounts of meat to foods like pasta or stew. Use beans or vegetables instead of meat in common dishes like chili or lasagna. Experiment with different cooking methods. Try roasting or broiling vegetables instead of steaming or sauteing them. Add frozen  vegetables to soups, stews, pasta, or rice. Add nuts or seeds for added healthy fat at each meal. You can add these to yogurt, salads, or vegetable dishes. Marinate fish or vegetables using olive oil, lemon juice, garlic, and fresh herbs. Meal planning  Plan to eat 1 vegetarian meal one day each week. Try to work up to 2 vegetarian meals, if possible. Eat seafood 2 or more times a week. Have healthy snacks readily available, such  as: Vegetable sticks with hummus. Greek yogurt. Fruit and nut trail mix. Eat balanced meals throughout the week. This includes: Fruit: 2-3 servings a day Vegetables: 4-5 servings a day Low-fat dairy: 2 servings a day Fish, poultry, or lean meat: 1 serving a day Beans and legumes: 2 or more servings a week Nuts and seeds: 1-2 servings a day Whole grains: 6-8 servings a day Extra-virgin olive oil: 3-4 servings a day Limit red meat and sweets to only a few servings a month What are my food choices? Mediterranean diet Recommended Grains: Whole-grain pasta. Brown rice. Bulgar wheat. Polenta. Couscous. Whole-wheat bread. Dwyane Glad. Vegetables: Artichokes. Beets. Broccoli. Cabbage. Carrots. Eggplant. Green beans. Chard. Kale. Spinach. Onions. Leeks. Peas. Squash. Tomatoes. Peppers. Radishes. Fruits: Apples. Apricots. Avocado. Berries. Bananas. Cherries. Dates. Figs. Grapes. Lemons. Melon. Oranges. Peaches. Plums. Pomegranate. Meats and other protein foods: Beans. Almonds. Sunflower seeds. Pine nuts. Peanuts. Cod. Salmon. Scallops. Shrimp. Tuna. Tilapia. Clams. Oysters. Eggs. Dairy: Low-fat milk. Cheese. Greek yogurt. Beverages: Water. Red wine. Herbal tea. Fats and oils: Extra virgin olive oil. Avocado oil. Grape seed oil. Sweets and desserts: [place] yogurt with honey. Baked apples. Poached pears. Trail mix. Seasoning and other foods: Basil. Cilantro. Coriander. Cumin. Mint. Parsley. Sage. Rosemary. Tarragon. Garlic. Oregano. Thyme. Pepper. Balsalmic vinegar. Tahini. Hummus. Tomato sauce. Olives. Mushrooms. Limit these Grains: Prepackaged pasta or rice dishes. Prepackaged cereal with added sugar. Vegetables: Deep fried potatoes (french fries). Fruits: Fruit canned in syrup. Meats and other protein foods: Beef. Pork. Lamb. Poultry with skin. Hot dogs. Helene Loader. Dairy: Ice cream. Sour cream. Whole milk. Beverages: Juice. Sugar-sweetened soft drinks. Beer. Liquor and spirits. Fats and oils:  Butter. Canola oil. Vegetable oil. Beef fat (tallow). Lard. Sweets and desserts: Cookies. Cakes. Pies. Candy. Seasoning and other foods: Mayonnaise. Premade sauces and marinades. The items listed may not be a complete list. Talk with your dietitian about what dietary choices are right for you. Summary The Mediterranean diet includes both food and lifestyle choices. Eat a variety of fresh fruits and vegetables, beans, nuts, seeds, and whole grains. Limit the amount of red meat and sweets that you eat. Talk with your health care provider about whether it is safe for you to drink red wine in moderation. This means 1 glass a day for nonpregnant women and 2 glasses a day for men. A glass of wine equals 5 oz (150 mL). This information is not intended to replace advice given to you by your health care provider. Make sure you discuss any questions you have with your health care provider. Document Released: 12/30/2015 Document Revised: 02/01/2016 Document Reviewed: 12/30/2015 Elsevier Interactive Patient Education  2017 [employer].

## 2023-07-02 NOTE — Progress Notes (Signed)
 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 This patient is accompanied in the office by his wife who supplements the history. Previous records as well as any outside records available were reviewed prior to todays visit.   Patient was last seen on 04/13/2023 with a MoCA of 26/30 Patient is on memantine  5 mg twice daily, tolerating well. MRI brain personally reviewed negative for acute changes, mild chronic small vessel disease essentially unchanged from prior MRI and mild atrophy seen. No hippocampal atrophy is noted. In view of this findings. Discussed patient plasma biomarkers in an effort to rule out AD. His wife suspects there is a component of ADD, this can be evaluated by psych in the future if indicated. He agrees to proceed with plan.      Recommendations:   Follow up in 3  months. Continue Memantine  5 mg twice daily. Side effects were discussed  Repeat neuropsych evaluation in 18 to 24 months for diagnostic clarity and disease trajectory Plasma markers including pTau and B amyloid 42-40 ratio, NF Light chain Recommend good control of cardiovascular risk factors Continue to control mood as per PCP    Neuropsych evaluation 06/05/2023 Briefly, results suggested significant impairment surrounding most aspects of learning and memory. Some variability was seen across retention percentages; however, normative performances were quite poor across encoding (i.e., learning), retrieval,  and recognition/consolidation aspects of memory given premorbid intellectual estimations. An isolated impairment was also exhibited across a working memory task, while variability was exhibited across executive functioning. significant impairment surrounding most aspects of learning and memory. Some variability was seen across retention percentages; however, normative performances were quite poor across encoding (i.e., learning), retrieval, and recognition/consolidation aspects of memory given premorbid intellectual estimations. An isolated impairment was also exhibited across a working memory task, while variability was exhibited across executive functioning. I continue to be unable to rule out the very early beginnings of a neurodegenerative illness such as Alzheimer's disease. However, memory as whole did not exhibit consistent decline and other non-memory areas commonly implicated in this illness (e.g., semantic fluency, confrontation naming) were strong and stable across both evaluations.   MRI of the brain June 2023, personally reviewed showed periventricular white matter changes, mildly advanced for age, reflecting the sequela of chronic microvascular ischemia, no acute intracranial abnormalities.    Past Medical History:  Diagnosis Date   Acute embolism and thrombosis of unspecified deep veins of unspecified lower extremity    Asymptomatic varicose veins of bilateral lower extremities    Chronic kidney disease, stage 3a    Chronic pain    Clotting disorder    hx clot in left leg/right leg   DJD (degenerative joint disease)    ED (erectile dysfunction) of organic origin    Elevated liver enzymes 07/21/2011   Environmental allergies    Essential hypertension    Generalized anxiety disorder    Headache 07/21/2011   Hematuria syndrome    Herniated lumbar disc without myelopathy 03/08/2020   Hypoglycemia due to type 2 diabetes mellitus  Incisional hernia, without obstruction or gangrene  02/10/2013   Insomnia    Lacunar infarction 2011   2011 MRI - Tiny acute infarct anterior medial left temporal lobe     Low back pain    Major depressive disorder    Malignant tumor of prostate    Mild cognitive impairment with memory loss 08/19/2021   Mixed hyperlipidemia    Motion sickness    Normocytic anemia 07/21/2011   Pharyngitis    Polyneuropathy due to type 2 diabetes mellitus    Polyp of colon    Renal insufficiency 07/21/2011   Sciatica    Splenomegaly 07/21/2011   Type II diabetes mellitus    Umbilical hernia    Vitamin D  deficiency      Past Surgical History:  Procedure Laterality Date   bilateral inguinal heria repairs     cholecystectomy     CHOLECYSTECTOMY     COLONOSCOPY     EYE SURGERY Bilateral    cataracts removed   HERNIA REPAIR     LUMBAR LAMINECTOMY/ DECOMPRESSION WITH MET-RX Right 03/08/2020   Procedure: Right Lumbar Five Sacral OneExtraforaminal microdiscectomy;  Surgeon: Manya Sells, MD;  Location: [facility] OR;  Service: Neurosurgery;  Laterality: Right;   PROSTATECTOMY     VARICOSE VEIN SURGERY Bilateral    laser - legs     PREVIOUS MEDICATIONS:   CURRENT MEDICATIONS:  Outpatient Encounter Medications as of 07/02/2023  Medication Sig   acetaminophen  (TYLENOL ) 325 MG tablet Take 650 mg by mouth every 6 (six) hours as needed for mild pain or moderate pain.    aspirin  81 MG tablet Take 162 mg by mouth daily.    atorvastatin  (LIPITOR) 20 MG tablet Take 20 mg by mouth every evening.    cholecalciferol (VITAMIN D ) 1000 UNITS tablet Take 2,000 Units by mouth every evening.    fenofibrate  (TRICOR ) 145 MG tablet Take 145 mg by mouth every evening.    glipiZIDE  (GLUCOTROL  XL) 2.5 MG 24 hr tablet Take 2.5 mg by mouth daily.   HYDROcodone -acetaminophen  (NORCO/VICODIN) 5-325 MG tablet Take 1 tablet by mouth every 6 (six) hours as needed for moderate pain.   lidocaine  (LIDODERM ) 5 % Place 1 patch onto the skin daily. Remove & Discard patch within 12 hours or  as directed by MD   memantine  (NAMENDA ) 5 MG tablet Take 1 tablet (5 mg at night) for 2 weeks, then increase to 1 tablet (5 mg) twice a day   metFORMIN  (GLUCOPHAGE -XR) 750 MG 24 hr tablet Take 750 mg by mouth 2 (two) times daily.   methylPREDNISolone  (MEDROL  DOSEPAK) 4 MG TBPK tablet Take 4 mg by mouth daily. Taper   metoprolol  tartrate (LOPRESSOR ) 25 MG tablet Take 25 mg by mouth 2 (two) times daily.   sertraline  (ZOLOFT ) 100 MG tablet Take 100 mg by mouth every evening.    telmisartan (MICARDIS) 40 MG tablet Take 40 mg by mouth daily.    No facility-administered encounter medications on file as of 07/02/2023.        04/13/2023   12:00 PM  Montreal Cognitive Assessment   Visuospatial/ Executive (0/5) 5  Naming (0/3) 3  Attention: Read list of digits (0/2) 2  Attention: Read list of letters (0/1) 1  Attention: Serial 7 subtraction starting at 100 (0/3) 3  Language: Repeat phrase (0/2) 1  Language : Fluency (0/1) 1  Abstraction (0/2) 1  Delayed Recall (0/5) 3  Orientation (0/6) 6  Total 26  Adjusted Score (based on education) 26  Total time spent on today's visit was 35 minutes dedicated to this patient today, preparing to see patient,  ordering tests and/or medications and counseling the patient, documenting clinical information in the EHR or other health record, independently interpreting results and communicating results to the patient/family, discussing treatment and goals, answering patient's questions and coordinating care.  Cc:  Benedetta Bradley, MD  Tex Filbert 07/02/2023 6:52 AM

## 2023-07-06 LAB — QUEST AD-DETECT™, BETA-AMYLOID 42/40 RATIO, PLASMA
ABETA 40: 272 pg/mL
ABETA 42/40 RATIO: 0.114 — ABNORMAL LOW (ref >=0.170)
ABETA 42: 31 pg/mL

## 2023-07-06 LAB — NEUROFILAMENT LIGHT CHAIN (NFL), PLASMA: Neurofilament Light Chain (NfL), Plasma: 2.99 pg/mL (ref <12.52)

## 2023-07-06 LAB — QUEST AD-DETECT PHOSPHORYLATED TAU217(P-TAU217), PLASMA: Quest Detect PTAU217, Plasma: 0.48 pg/mL — ABNORMAL HIGH (ref <=0.15)

## 2023-07-06 LAB — QUEST AD-DETECT® PHOSPHORYLATED TAU181(P-TAU181), PLASMA: QUEST AD DETECT PTAU181, PLASMA: 1.53 pg/mL — ABNORMAL HIGH (ref <=1.07)

## 2023-07-13 ENCOUNTER — Telehealth: Payer: Self-pay | Admitting: Physician Assistant

## 2023-07-13 NOTE — Telephone Encounter (Signed)
Patient cancelled neuro testing for 07/22/2023. Will discuss with [facility] Dec on Monday.

## 2023-07-13 NOTE — Telephone Encounter (Signed)
Patient wants to know the results of his test he had to see if he has alzheimers

## 2023-07-16 NOTE — Telephone Encounter (Signed)
 Pt and wife called in anxious to get the lab results

## 2023-07-19 ENCOUNTER — Telehealth: Payer: Self-pay | Admitting: Physician Assistant

## 2023-07-19 NOTE — Telephone Encounter (Signed)
 The patients wife called and she is very confused. she thought sara told her to get in with a neurologist. She mentioned [facility] or somewhere. She said our office called them about the labs and they are concerned cause he has early stages of dementia. I told her he sees sara and Milbert Coulter and that's what he sees them for. I told them I was would relay a message for someone to call them. They seem to be very confused. She keeps saying they told her stan needs to see a neurologist. Their friend told them stan needs to see a neurologist. Please call them and discuss. she is happy with our office but are very confused

## 2023-07-20 NOTE — Telephone Encounter (Signed)
 Call wife [phone](Mary)

## 2023-07-20 NOTE — Telephone Encounter (Signed)
 Will call her back, phone cut out and could not hear her.

## 2023-07-20 NOTE — Telephone Encounter (Signed)
 I contacted patient she thanked me for calling.

## 2023-07-23 ENCOUNTER — Ambulatory Visit: Payer: Self-pay

## 2023-07-23 ENCOUNTER — Institutional Professional Consult (permissible substitution): Payer: Medicare Other | Admitting: Psychology

## 2023-08-01 ENCOUNTER — Encounter: Payer: Medicare Other | Admitting: Psychology

## 2023-08-13 DIAGNOSIS — I10 Essential (primary) hypertension: Secondary | ICD-10-CM | POA: Diagnosis not present

## 2023-08-13 DIAGNOSIS — E1142 Type 2 diabetes mellitus with diabetic polyneuropathy: Secondary | ICD-10-CM | POA: Diagnosis not present

## 2023-08-13 DIAGNOSIS — N183 Chronic kidney disease, stage 3 unspecified: Secondary | ICD-10-CM | POA: Diagnosis not present

## 2023-08-20 DIAGNOSIS — E782 Mixed hyperlipidemia: Secondary | ICD-10-CM | POA: Diagnosis not present

## 2023-08-20 DIAGNOSIS — E1142 Type 2 diabetes mellitus with diabetic polyneuropathy: Secondary | ICD-10-CM | POA: Diagnosis not present

## 2023-08-20 DIAGNOSIS — I10 Essential (primary) hypertension: Secondary | ICD-10-CM | POA: Diagnosis not present

## 2023-08-20 DIAGNOSIS — Z8546 Personal history of malignant neoplasm of prostate: Secondary | ICD-10-CM | POA: Diagnosis not present

## 2023-09-17 DIAGNOSIS — J0100 Acute maxillary sinusitis, unspecified: Secondary | ICD-10-CM | POA: Diagnosis not present

## 2023-09-17 DIAGNOSIS — J069 Acute upper respiratory infection, unspecified: Secondary | ICD-10-CM | POA: Diagnosis not present

## 2023-09-19 DIAGNOSIS — Z8546 Personal history of malignant neoplasm of prostate: Secondary | ICD-10-CM | POA: Diagnosis not present

## 2023-09-19 DIAGNOSIS — E1142 Type 2 diabetes mellitus with diabetic polyneuropathy: Secondary | ICD-10-CM | POA: Diagnosis not present

## 2023-09-19 DIAGNOSIS — I10 Essential (primary) hypertension: Secondary | ICD-10-CM | POA: Diagnosis not present

## 2023-09-19 DIAGNOSIS — E782 Mixed hyperlipidemia: Secondary | ICD-10-CM | POA: Diagnosis not present

## 2023-10-08 ENCOUNTER — Ambulatory Visit (INDEPENDENT_AMBULATORY_CARE_PROVIDER_SITE_OTHER): Payer: Medicare Other | Admitting: Physician Assistant

## 2023-10-08 ENCOUNTER — Encounter: Payer: Self-pay | Admitting: Physician Assistant

## 2023-10-08 VITALS — BP 110/71 | HR 83 | Resp 20 | Wt 179.0 lb

## 2023-10-08 DIAGNOSIS — G3184 Mild cognitive impairment, so stated: Secondary | ICD-10-CM | POA: Diagnosis not present

## 2023-10-08 NOTE — Progress Notes (Addendum)
 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 Patient is on memantine  5 mg bid, tolerating well. Plasma biomarkers are showing probability of Alzheimer's Disease. [facility] has an appointment at [facility] for further evaluation, consideration for any trials if a candidate. He is currently on memantine  5 mg bid, tolerating well. Patient is able to participate on ADLs and to drive. He is considering retiring from his job, mood is good.     Recommendations:   Follow up in  6 months. Continue Memantine  5 mg twice daily. Side effects were discussed  Agree with [facility] evaluation for second opinion. Repeat neuropsych evaluation in 12 to 18 months for diagnostic clarity and disease trajectory. Recommend good control of cardiovascular risk factors Continue to control mood as per PCP    Subjective:   This patient is accompanied in the office by his wife who supplements the history. Previous records as well as any outside records available were reviewed prior to todays visit.   Patient was last seen on 07/02/23, with last MoCA on 03/2023 of 26/30.    Any changes in memory since last visit? " STM is worse, not drastic but more than before. Some good and not so good days"."My ability to reason is not impaired".  "He has a hard time following a plot  when watching a movie, but ok with book".-wife says. At times he cannot remember  how to navigate his emails.   repeats  oneself?  Endorsed, mostly with appointments Disoriented when walking into a room?  Patient denies    Misplacing objects?  Always did.   Wandering behavior?   Denies. Any personality changes since last visit? Denies.   Any worsening depression?: denies.   Hallucinations or paranoia?  Denies.   Seizures?   Denies.    Any sleep changes? Sleeps well. Denies vivid dreams, REM behavior or sleepwalking   Sleep apnea?   denies   Any hygiene concerns?   Denies.   Independent of bathing and dressing?  Endorsed  Does the patient needs help with medications? Patient is in charge   Who is in charge of the finances?  Patient is in charge, son monitors (he is an Airline pilot)     Any changes in appetite?  He eats healthy, "but very small portions"-wife says     Patient have trouble swallowing?  Denies.   Does the patient cook?  No  Any headaches?    Denies.   Vision changes? Denies. Chronic pain?  Denies.   Ambulates with difficulty?    Denies.    Recent falls or head injuries?    Denies.      Unilateral weakness, numbness or tingling?  Denies.   Any tremors?  Denies.   Any anosmia?    Denies.   Any incontinence of urine?  Denies.   Any bowel dysfunction? History of diarrhea due to gallbladder disease     Patient lives with his wife. Does the patient drive? Night driving may be more difficult, I  need to think more where to turn. He drives well during the day   MRI brain 04/18/2023 personally reviewed negative for acute changes, mild chronic small vessel disease essentially unchanged from prior MRI and mild atrophy seen. No hippocampal atrophy is noted.   Plasma biomarkers 06/2023: pTau 181 1.53 and AB   42-40 ratio 0.114,  pTAU 217 0.46, NF Light chain 2.99   Neuropsych evaluation 06/05/2023 Briefly, results suggested significant impairment surrounding most aspects of learning and memory. Some variability was seen across retention percentages; however, normative performances were quite poor across  encoding (i.e., learning), retrieval, and recognition/consolidation aspects of memory given premorbid intellectual estimations. An isolated impairment was also exhibited across a working memory task, while variability was exhibited across executive functioning. significant impairment surrounding most aspects of learning and memory. Some variability was seen across retention percentages; however, normative performances were quite poor across encoding (i.e., learning), retrieval, and recognition/consolidation aspects of memory given premorbid intellectual estimations. An isolated impairment was also exhibited across a working memory task, while variability was exhibited across executive functioning. I continue to be unable to rule out the very early beginnings of a neurodegenerative illness such as Alzheimer's disease. However, memory as whole did not exhibit consistent decline and other non-memory areas commonly implicated in this illness (e.g., semantic fluency, confrontation naming) were strong and stable across both evaluations.   Initial visit 06/12/22 How long did patient have memory difficulties?  For about 4-5 years  Patient reports some difficulty remembering new information, recent conversations, names. "I always was very good with numbers".  LTM is great. Has more difficulty with technology such as cells and remote control. His wife reports that there may be a component of ADD which was never diagnosed. HE reports that may have underlying dyslexia as well, although not sure. repeats oneself?  Endorsed, especially with appointments. Disoriented when walking into a room?  Patient denies    Leaving objects in unusual places?  Denies.   Wandering behavior? Denies.   Any personality changes, or depression, anxiety? Denies  Hallucinations or paranoia? denies   Seizures? denies    Any sleep changes? Sleeps well since decreasing the work load.  Reports stressful vivid dreams at times, talks in his sleep,  and moves frequently at night. Denies sleepwalking   Sleep apnea? Denies.   Any hygiene concerns?  Denies.   Independent of bathing and dressing? Endorsed  Does the patient need help with medications?  Wife is in charge, but wife states he can do it by himself.    Who is in charge of the finances?  Son is in charge who is a IT trainer     Any changes in appetite?   Denies. Eats well.     Patient have trouble swallowing?  Denies.   Does the patient cook? No.  Any headaches?  Denies.   Chronic pain?  Endorsed, especially at neck and back, but "eased up over the years after surgery for ruptured disk" Ambulates with difficulty? Denies, uses the treadmill, climbs steps to lower the sugars. Has a mini gym at the garage. Recent falls or head injuries?  In 2022 he fell on the concrete hitting his head, he denies any loss of consciousness at the time.    Vision changes?  Denies any new issues.  Has a history of color blindness Any strokelike symptoms? Denies.   Any tremors? Denies.  Any anosmia? He does have decreased taste and smell  Any incontinence of urine? Denies.   Any bowel  dysfunction? Chronic diarrhea issues       Patient lives with wife  History of heavy alcohol intake? Denies.   History of heavy tobacco use? Denies.   Family history of dementia?   Father with dementia but he had ALS, Brother FTD, sister with Pick's disease, mother had senile dementia died at 64   Does patient drive?  Yes, "but I have never been good with directions"  Litigating attorney., encounters more difficulty but "everything changed, everything I faster".    Past Medical History:  Diagnosis Date   Acute embolism and thrombosis of unspecified deep veins of unspecified lower extremity    Asymptomatic varicose veins of bilateral lower extremities    Chronic kidney disease, stage 3a    Chronic pain    Clotting disorder    hx clot in left leg/right leg   DJD (degenerative joint disease)    ED (erectile dysfunction) of  organic origin    Elevated liver enzymes 07/21/2011   Environmental allergies    Essential hypertension    Generalized anxiety disorder    Headache 07/21/2011   Hematuria syndrome    Herniated lumbar disc without myelopathy 03/08/2020   Hypoglycemia due to type 2 diabetes mellitus    Incisional hernia, without obstruction or gangrene 02/10/2013   Insomnia    Lacunar infarction 2011   2011 MRI - Tiny acute infarct anterior medial left temporal lobe     Low back pain    Major depressive disorder    Malignant tumor of prostate    Mild cognitive impairment with memory loss 08/19/2021   Mixed hyperlipidemia    Motion sickness    Normocytic anemia 07/21/2011   Pharyngitis    Polyneuropathy due to type 2 diabetes mellitus    Polyp of colon    Renal insufficiency 07/21/2011   Sciatica    Splenomegaly 07/21/2011   Type II diabetes mellitus    Umbilical hernia    Vitamin D  deficiency      Past Surgical History:  Procedure Laterality Date   bilateral inguinal heria repairs     cholecystectomy     CHOLECYSTECTOMY     COLONOSCOPY     EYE SURGERY Bilateral    cataracts removed   HERNIA REPAIR     LUMBAR LAMINECTOMY/ DECOMPRESSION WITH MET-RX Right 03/08/2020   Procedure: Right Lumbar Five Sacral OneExtraforaminal microdiscectomy;  Surgeon: Manya Sells, MD;  Location: [facility] OR;  Service: Neurosurgery;  Laterality: Right;   PROSTATECTOMY     VARICOSE VEIN SURGERY Bilateral    laser - legs     PREVIOUS MEDICATIONS:   CURRENT MEDICATIONS:  Outpatient Encounter Medications as of 10/08/2023  Medication Sig   acetaminophen  (TYLENOL ) 325 MG tablet Take 650 mg by mouth every 6 (six) hours as needed for mild pain or moderate pain.    aspirin  81 MG tablet Take 162 mg by mouth daily.    atorvastatin  (LIPITOR) 20 MG tablet Take 20 mg by mouth every evening.    cholecalciferol (VITAMIN D ) 1000 UNITS tablet Take 2,000 Units by mouth every evening.    fenofibrate  (TRICOR ) 145 MG tablet Take  145 mg by mouth every evening.    glipiZIDE  (GLUCOTROL  XL) 2.5 MG 24 hr tablet Take 2.5 mg by mouth daily.   HYDROcodone -acetaminophen  (NORCO/VICODIN) 5-325 MG tablet Take 1 tablet by mouth every 6 (six) hours as needed for moderate pain.   lidocaine  (LIDODERM ) 5 % Place 1 patch onto the skin daily. Remove & Discard patch within 12 hours or  as directed by MD   memantine  (NAMENDA ) 5 MG tablet Take 1 tablet (5 mg at night) for 2 weeks, then increase to 1 tablet (5 mg) twice a day   metFORMIN  (GLUCOPHAGE -XR) 750 MG 24 hr tablet Take 750 mg by mouth 2 (two) times daily.   methylPREDNISolone  (MEDROL  DOSEPAK) 4 MG TBPK tablet Take 4 mg by mouth daily. Taper   metoprolol  tartrate (LOPRESSOR ) 25 MG tablet Take 25 mg by mouth 2 (two) times daily.   sertraline  (ZOLOFT ) 100 MG tablet Take 100 mg by mouth every evening.    telmisartan (MICARDIS) 40 MG tablet Take 40 mg by mouth daily.    No facility-administered encounter medications on file as of 10/08/2023.     Objective:     PHYSICAL EXAMINATION:    VITALS:   Vitals:   10/08/23 0842  BP: 110/71  Pulse: 83  Resp: 20  SpO2: 98%  Weight: 179 lb (81.2 kg)    GEN:  The patient appears stated age and is in NAD. HEENT:  Normocephalic, atraumatic.   Neurological examination:  General: NAD, well-groomed, appears stated age. Orientation: The patient is alert. Oriented to person, place and to date.  Cranial nerves: There is good facial symmetry.The speech is fluent and clear. No aphasia or dysarthria. Fund of knowledge is appropriate. Recent memory impaired and remote memory is normal.  Attention and concentration are normal.  Able to name objects and repeat phrases.  Hearing is intact to conversational tone  .  Sensation: Sensation is intact to light touch throughout Motor: Strength is at least antigravity x4. DTR's 2/4 in UE/LE      04/13/2023   12:00 PM  Montreal Cognitive Assessment   Visuospatial/ Executive (0/5) 5  Naming (0/3) 3   Attention: Read list of digits (0/2) 2  Attention: Read list of letters (0/1) 1  Attention: Serial 7 subtraction starting at 100 (0/3) 3  Language: Repeat phrase (0/2) 1  Language : Fluency (0/1) 1  Abstraction (0/2) 1  Delayed Recall (0/5) 3  Orientation (0/6) 6  Total 26  Adjusted Score (based on education) 26        No data to display             Movement examination: Tone: There is normal tone in the UE/LE Abnormal movements:  no tremor.  No myoclonus.  No asterixis.   Coordination:  There is no decremation with RAM's. Normal finger to nose  Gait and Station: The patient has no difficulty arising out of a deep-seated chair without the use of the hands. The patient's stride length is good.  Gait is cautious and narrow.   Thank you for allowing us  the opportunity to participate in the care of this nice patient. Please do not hesitate to contact us  for any questions or concerns.   Total time spent on today's visit was 30 minutes dedicated to this patient today, preparing to see patient, examining the patient, ordering tests and/or medications and counseling the patient, documenting clinical information in the EHR or other health record, independently interpreting results and communicating results to the patient/family, discussing treatment and goals, answering patient's questions and coordinating care.  Cc:  Benedetta Bradley, MD  Tex Filbert 10/08/2023 11:10 AM

## 2023-10-08 NOTE — Patient Instructions (Signed)
 It was a pleasure to see you today at our office.   Recommendations:    Check your hearing  Follow up in 6 months   Continue memantine   5 mg daily     For psychiatric meds, mood meds: Please have your primary care physician manage these medications.  If you have any severe symptoms of a stroke, or other severe issues such as confusion,severe chills or fever, etc call 911 or go to the ER as you may need to be evaluated further     For assessment of decision of mental capacity and competency:  Call Dr. Laverne Potter, geriatric psychiatrist at [phone]  Counseling regarding caregiver distress, including caregiver depression, anxiety and issues regarding community resources, adult day care programs, adult living facilities, or memory care questions:  please contact your  Primary Doctor's Social Worker   Whom to call: Memory  decline, memory medications: Call our office [phone]    [email or web address]   RECOMMENDATIONS FOR ALL PATIENTS WITH MEMORY PROBLEMS: 1. Continue to exercise (Recommend 30 minutes of walking everyday, or 3 hours every week) 2. Increase social interactions - continue going to [place] and enjoy social gatherings with friends and family 3. Eat healthy, avoid fried foods and eat more fruits and vegetables 4. Maintain adequate blood pressure, blood sugar, and blood cholesterol level. Reducing the risk of stroke and cardiovascular disease also helps promoting better memory. 5. Avoid stressful situations. Live a simple life and avoid aggravations. Organize your time and prepare for the next day in anticipation. 6. Sleep well, avoid any interruptions of sleep and avoid any distractions in the bedroom that may interfere with adequate sleep quality 7. Avoid sugar, avoid sweets as there is a strong link between excessive sugar intake, diabetes, and cognitive impairment We discussed the Mediterranean diet, which has  been shown to help patients reduce the risk of progressive memory disorders and reduces cardiovascular risk. This includes eating fish, eat fruits and green leafy vegetables, nuts like almonds and hazelnuts, walnuts, and also use olive oil. Avoid fast foods and fried foods as much as possible. Avoid sweets and sugar as sugar use has been linked to worsening of memory function.  There is always a concern of gradual progression of memory problems. If this is the case, then we may need to adjust level of care according to patient needs. Support, both to the patient and caregiver, should then be put into place.        DRIVING: Regarding driving, in patients with progressive memory problems, driving will be impaired. We advise to have someone else do the driving if trouble finding directions or if minor accidents are reported. Independent driving assessment is available to determine safety of driving.   If you are interested in the driving assessment, you can contact the following:  [facility] [employer] [facility] in [place] [phone]  [facility] [phone]  [facility] [phone]  [facility] [phone] or [phone]   FALL PRECAUTIONS: Be cautious when walking. Scan the area for obstacles that may increase the risk of trips and falls. When getting up in the mornings, sit up at the edge of the bed for a few minutes before getting out of bed. Consider elevating the bed at the head end to avoid drop of blood pressure when getting up. Walk always in a well-lit room (use night lights in the walls). Avoid area rugs or power cords from appliances in the middle of the walkways. Use a walker or a cane if necessary  and consider physical therapy for balance exercise. Get your eyesight checked regularly.  FINANCIAL OVERSIGHT: Supervision, especially oversight when making financial decisions or transactions is also recommended.  HOME SAFETY: Consider the safety of  the kitchen when operating appliances like stoves, microwave oven, and blender. Consider having supervision and share cooking responsibilities until no longer able to participate in those. Accidents with firearms and other hazards in the house should be identified and addressed as well.   ABILITY TO BE LEFT ALONE: If patient is unable to contact 911 operator, consider using [employer], or when the need is there, arrange for someone to stay with patients. Smoking is a fire hazard, consider supervision or cessation. Risk of wandering should be assessed by caregiver and if detected at any point, supervision and safe proof recommendations should be instituted.  MEDICATION SUPERVISION: Inability to self-administer medication needs to be constantly addressed. Implement a mechanism to ensure safe administration of the medications.      Mediterranean Diet A Mediterranean diet refers to food and lifestyle choices that are based on the traditions of countries located on the [employer]. This way of eating has been shown to help prevent certain conditions and improve outcomes for people who have chronic diseases, like kidney disease and heart disease. What are tips for following this plan? Lifestyle  Cook and eat meals together with your family, when possible. Drink enough fluid to keep your urine clear or pale yellow. Be physically active every day. This includes: Aerobic exercise like running or swimming. Leisure activities like gardening, walking, or housework. Get 7-8 hours of sleep each night. If recommended by your health care provider, drink red wine in moderation. This means 1 glass a day for nonpregnant women and 2 glasses a day for men. A glass of wine equals 5 oz (150 mL). Reading food labels  Check the serving size of packaged foods. For foods such as rice and pasta, the serving size refers to the amount of cooked product, not dry. Check the total fat in packaged foods. Avoid foods that  have saturated fat or trans fats. Check the ingredients list for added sugars, such as corn syrup. Shopping  At the grocery store, buy most of your food from the areas near the walls of the store. This includes: Fresh fruits and vegetables (produce). Grains, beans, nuts, and seeds. Some of these may be available in unpackaged forms or large amounts (in bulk). Fresh seafood. Poultry and eggs. Low-fat dairy products. Buy whole ingredients instead of prepackaged foods. Buy fresh fruits and vegetables in-season from local farmers markets. Buy frozen fruits and vegetables in resealable bags. If you do not have access to quality fresh seafood, buy precooked frozen shrimp or canned fish, such as tuna, salmon, or sardines. Buy small amounts of raw or cooked vegetables, salads, or olives from the deli or salad bar at your store. Stock your pantry so you always have certain foods on hand, such as olive oil, canned tuna, canned tomatoes, rice, pasta, and beans. Cooking  Cook foods with extra-virgin olive oil instead of using butter or other vegetable oils. Have meat as a side dish, and have vegetables or grains as your main dish. This means having meat in small portions or adding small amounts of meat to foods like pasta or stew. Use beans or vegetables instead of meat in common dishes like chili or lasagna. Experiment with different cooking methods. Try roasting or broiling vegetables instead of steaming or sauteing them. Add frozen vegetables to soups, stews,  pasta, or rice. Add nuts or seeds for added healthy fat at each meal. You can add these to yogurt, salads, or vegetable dishes. Marinate fish or vegetables using olive oil, lemon juice, garlic, and fresh herbs. Meal planning  Plan to eat 1 vegetarian meal one day each week. Try to work up to 2 vegetarian meals, if possible. Eat seafood 2 or more times a week. Have healthy snacks readily available, such as: Vegetable sticks with hummus. Greek  yogurt. Fruit and nut trail mix. Eat balanced meals throughout the week. This includes: Fruit: 2-3 servings a day Vegetables: 4-5 servings a day Low-fat dairy: 2 servings a day Fish, poultry, or lean meat: 1 serving a day Beans and legumes: 2 or more servings a week Nuts and seeds: 1-2 servings a day Whole grains: 6-8 servings a day Extra-virgin olive oil: 3-4 servings a day Limit red meat and sweets to only a few servings a month What are my food choices? Mediterranean diet Recommended Grains: Whole-grain pasta. Brown rice. Bulgar wheat. Polenta. Couscous. Whole-wheat bread. Dwyane Glad. Vegetables: Artichokes. Beets. Broccoli. Cabbage. Carrots. Eggplant. Green beans. Chard. Kale. Spinach. Onions. Leeks. Peas. Squash. Tomatoes. Peppers. Radishes. Fruits: Apples. Apricots. Avocado. Berries. Bananas. Cherries. Dates. Figs. Grapes. Lemons. Melon. Oranges. Peaches. Plums. Pomegranate. Meats and other protein foods: Beans. Almonds. Sunflower seeds. Pine nuts. Peanuts. Cod. Salmon. Scallops. Shrimp. Tuna. Tilapia. Clams. Oysters. Eggs. Dairy: Low-fat milk. Cheese. Greek yogurt. Beverages: Water. Red wine. Herbal tea. Fats and oils: Extra virgin olive oil. Avocado oil. Grape seed oil. Sweets and desserts: [place] yogurt with honey. Baked apples. Poached pears. Trail mix. Seasoning and other foods: Basil. Cilantro. Coriander. Cumin. Mint. Parsley. Sage. Rosemary. Tarragon. Garlic. Oregano. Thyme. Pepper. Balsalmic vinegar. Tahini. Hummus. Tomato sauce. Olives. Mushrooms. Limit these Grains: Prepackaged pasta or rice dishes. Prepackaged cereal with added sugar. Vegetables: Deep fried potatoes (french fries). Fruits: Fruit canned in syrup. Meats and other protein foods: Beef. Pork. Lamb. Poultry with skin. Hot dogs. Helene Loader. Dairy: Ice cream. Sour cream. Whole milk. Beverages: Juice. Sugar-sweetened soft drinks. Beer. Liquor and spirits. Fats and oils: Butter. Canola oil. Vegetable oil. Beef  fat (tallow). Lard. Sweets and desserts: Cookies. Cakes. Pies. Candy. Seasoning and other foods: Mayonnaise. Premade sauces and marinades. The items listed may not be a complete list. Talk with your dietitian about what dietary choices are right for you. Summary The Mediterranean diet includes both food and lifestyle choices. Eat a variety of fresh fruits and vegetables, beans, nuts, seeds, and whole grains. Limit the amount of red meat and sweets that you eat. Talk with your health care provider about whether it is safe for you to drink red wine in moderation. This means 1 glass a day for nonpregnant women and 2 glasses a day for men. A glass of wine equals 5 oz (150 mL). This information is not intended to replace advice given to you by your health care provider. Make sure you discuss any questions you have with your health care provider. Document Released: 12/30/2015 Document Revised: 02/01/2016 Document Reviewed: 12/30/2015 Elsevier Interactive Patient Education  2017 [employer].

## 2023-10-20 DIAGNOSIS — E782 Mixed hyperlipidemia: Secondary | ICD-10-CM | POA: Diagnosis not present

## 2023-10-20 DIAGNOSIS — Z8546 Personal history of malignant neoplasm of prostate: Secondary | ICD-10-CM | POA: Diagnosis not present

## 2023-10-20 DIAGNOSIS — E1142 Type 2 diabetes mellitus with diabetic polyneuropathy: Secondary | ICD-10-CM | POA: Diagnosis not present

## 2023-10-20 DIAGNOSIS — N183 Chronic kidney disease, stage 3 unspecified: Secondary | ICD-10-CM | POA: Diagnosis not present

## 2023-10-20 DIAGNOSIS — I10 Essential (primary) hypertension: Secondary | ICD-10-CM | POA: Diagnosis not present

## 2023-10-30 DIAGNOSIS — I10 Essential (primary) hypertension: Secondary | ICD-10-CM | POA: Diagnosis not present

## 2023-10-30 DIAGNOSIS — E1142 Type 2 diabetes mellitus with diabetic polyneuropathy: Secondary | ICD-10-CM | POA: Diagnosis not present

## 2023-10-30 DIAGNOSIS — N183 Chronic kidney disease, stage 3 unspecified: Secondary | ICD-10-CM | POA: Diagnosis not present

## 2023-11-06 DIAGNOSIS — I8311 Varicose veins of right lower extremity with inflammation: Secondary | ICD-10-CM | POA: Diagnosis not present

## 2023-11-06 DIAGNOSIS — R252 Cramp and spasm: Secondary | ICD-10-CM | POA: Diagnosis not present

## 2023-11-06 DIAGNOSIS — I83893 Varicose veins of bilateral lower extremities with other complications: Secondary | ICD-10-CM | POA: Diagnosis not present

## 2023-11-06 DIAGNOSIS — I8312 Varicose veins of left lower extremity with inflammation: Secondary | ICD-10-CM | POA: Diagnosis not present

## 2023-11-19 DIAGNOSIS — I10 Essential (primary) hypertension: Secondary | ICD-10-CM | POA: Diagnosis not present

## 2023-11-19 DIAGNOSIS — Z8546 Personal history of malignant neoplasm of prostate: Secondary | ICD-10-CM | POA: Diagnosis not present

## 2023-11-19 DIAGNOSIS — E1142 Type 2 diabetes mellitus with diabetic polyneuropathy: Secondary | ICD-10-CM | POA: Diagnosis not present

## 2023-11-19 DIAGNOSIS — E782 Mixed hyperlipidemia: Secondary | ICD-10-CM | POA: Diagnosis not present

## 2023-11-29 DIAGNOSIS — N183 Chronic kidney disease, stage 3 unspecified: Secondary | ICD-10-CM | POA: Diagnosis not present

## 2023-11-29 DIAGNOSIS — I10 Essential (primary) hypertension: Secondary | ICD-10-CM | POA: Diagnosis not present

## 2023-11-29 DIAGNOSIS — E1142 Type 2 diabetes mellitus with diabetic polyneuropathy: Secondary | ICD-10-CM | POA: Diagnosis not present

## 2023-12-05 DIAGNOSIS — N1831 Chronic kidney disease, stage 3a: Secondary | ICD-10-CM | POA: Diagnosis not present

## 2023-12-05 DIAGNOSIS — H9190 Unspecified hearing loss, unspecified ear: Secondary | ICD-10-CM | POA: Diagnosis not present

## 2023-12-05 DIAGNOSIS — Z818 Family history of other mental and behavioral disorders: Secondary | ICD-10-CM | POA: Diagnosis not present

## 2023-12-05 DIAGNOSIS — G3184 Mild cognitive impairment, so stated: Secondary | ICD-10-CM | POA: Diagnosis not present

## 2023-12-05 DIAGNOSIS — E1122 Type 2 diabetes mellitus with diabetic chronic kidney disease: Secondary | ICD-10-CM | POA: Diagnosis not present

## 2023-12-05 DIAGNOSIS — G309 Alzheimer's disease, unspecified: Secondary | ICD-10-CM | POA: Diagnosis not present

## 2023-12-05 DIAGNOSIS — F0280 Dementia in other diseases classified elsewhere without behavioral disturbance: Secondary | ICD-10-CM | POA: Diagnosis not present

## 2023-12-19 DIAGNOSIS — H6123 Impacted cerumen, bilateral: Secondary | ICD-10-CM | POA: Diagnosis not present

## 2023-12-28 DIAGNOSIS — Z1589 Genetic susceptibility to other disease: Secondary | ICD-10-CM | POA: Diagnosis not present

## 2023-12-28 DIAGNOSIS — G3184 Mild cognitive impairment, so stated: Secondary | ICD-10-CM | POA: Diagnosis not present

## 2023-12-28 DIAGNOSIS — Z818 Family history of other mental and behavioral disorders: Secondary | ICD-10-CM | POA: Diagnosis not present

## 2023-12-29 DIAGNOSIS — I10 Essential (primary) hypertension: Secondary | ICD-10-CM | POA: Diagnosis not present

## 2023-12-29 DIAGNOSIS — N183 Chronic kidney disease, stage 3 unspecified: Secondary | ICD-10-CM | POA: Diagnosis not present

## 2023-12-29 DIAGNOSIS — E1142 Type 2 diabetes mellitus with diabetic polyneuropathy: Secondary | ICD-10-CM | POA: Diagnosis not present

## 2023-12-31 ENCOUNTER — Ambulatory Visit: Admitting: Audiology

## 2024-01-04 DIAGNOSIS — G3184 Mild cognitive impairment, so stated: Secondary | ICD-10-CM | POA: Diagnosis not present

## 2024-01-18 DIAGNOSIS — Z1331 Encounter for screening for depression: Secondary | ICD-10-CM | POA: Diagnosis not present

## 2024-01-18 DIAGNOSIS — Z8546 Personal history of malignant neoplasm of prostate: Secondary | ICD-10-CM | POA: Diagnosis not present

## 2024-01-18 DIAGNOSIS — I679 Cerebrovascular disease, unspecified: Secondary | ICD-10-CM | POA: Diagnosis not present

## 2024-01-18 DIAGNOSIS — N183 Chronic kidney disease, stage 3 unspecified: Secondary | ICD-10-CM | POA: Diagnosis not present

## 2024-01-18 DIAGNOSIS — Z0000 Encounter for general adult medical examination without abnormal findings: Secondary | ICD-10-CM | POA: Diagnosis not present

## 2024-01-18 DIAGNOSIS — E1142 Type 2 diabetes mellitus with diabetic polyneuropathy: Secondary | ICD-10-CM | POA: Diagnosis not present

## 2024-01-18 DIAGNOSIS — I10 Essential (primary) hypertension: Secondary | ICD-10-CM | POA: Diagnosis not present

## 2024-01-18 DIAGNOSIS — G309 Alzheimer's disease, unspecified: Secondary | ICD-10-CM | POA: Diagnosis not present

## 2024-01-18 DIAGNOSIS — F0280 Dementia in other diseases classified elsewhere without behavioral disturbance: Secondary | ICD-10-CM | POA: Diagnosis not present

## 2024-01-18 DIAGNOSIS — E538 Deficiency of other specified B group vitamins: Secondary | ICD-10-CM | POA: Diagnosis not present

## 2024-01-18 DIAGNOSIS — K529 Noninfective gastroenteritis and colitis, unspecified: Secondary | ICD-10-CM | POA: Diagnosis not present

## 2024-01-18 DIAGNOSIS — R413 Other amnesia: Secondary | ICD-10-CM | POA: Diagnosis not present

## 2024-01-18 DIAGNOSIS — E782 Mixed hyperlipidemia: Secondary | ICD-10-CM | POA: Diagnosis not present

## 2024-01-18 DIAGNOSIS — Z79899 Other long term (current) drug therapy: Secondary | ICD-10-CM | POA: Diagnosis not present

## 2024-01-20 DIAGNOSIS — I10 Essential (primary) hypertension: Secondary | ICD-10-CM | POA: Diagnosis not present

## 2024-01-20 DIAGNOSIS — E1142 Type 2 diabetes mellitus with diabetic polyneuropathy: Secondary | ICD-10-CM | POA: Diagnosis not present

## 2024-01-20 DIAGNOSIS — Z8546 Personal history of malignant neoplasm of prostate: Secondary | ICD-10-CM | POA: Diagnosis not present

## 2024-01-20 DIAGNOSIS — E782 Mixed hyperlipidemia: Secondary | ICD-10-CM | POA: Diagnosis not present

## 2024-01-28 DIAGNOSIS — N183 Chronic kidney disease, stage 3 unspecified: Secondary | ICD-10-CM | POA: Diagnosis not present

## 2024-01-28 DIAGNOSIS — I10 Essential (primary) hypertension: Secondary | ICD-10-CM | POA: Diagnosis not present

## 2024-01-28 DIAGNOSIS — E1142 Type 2 diabetes mellitus with diabetic polyneuropathy: Secondary | ICD-10-CM | POA: Diagnosis not present

## 2024-02-05 DIAGNOSIS — L819 Disorder of pigmentation, unspecified: Secondary | ICD-10-CM | POA: Diagnosis not present

## 2024-02-05 DIAGNOSIS — R252 Cramp and spasm: Secondary | ICD-10-CM | POA: Diagnosis not present

## 2024-02-05 DIAGNOSIS — I8312 Varicose veins of left lower extremity with inflammation: Secondary | ICD-10-CM | POA: Diagnosis not present

## 2024-02-05 DIAGNOSIS — I8311 Varicose veins of right lower extremity with inflammation: Secondary | ICD-10-CM | POA: Diagnosis not present

## 2024-02-06 DIAGNOSIS — G309 Alzheimer's disease, unspecified: Secondary | ICD-10-CM | POA: Diagnosis not present

## 2024-02-06 DIAGNOSIS — F0280 Dementia in other diseases classified elsewhere without behavioral disturbance: Secondary | ICD-10-CM | POA: Diagnosis not present

## 2024-02-08 IMAGING — CR DG LUMBAR SPINE 2-3V
3 series · 3 of 3 positions shown · non-contrast
Comparison: Fluoroscopic image done on 03/08/2020

CLINICAL DATA: Low back pain

EXAM:
LUMBAR SPINE - 2-3 VIEW

[t l-spine a.p.]
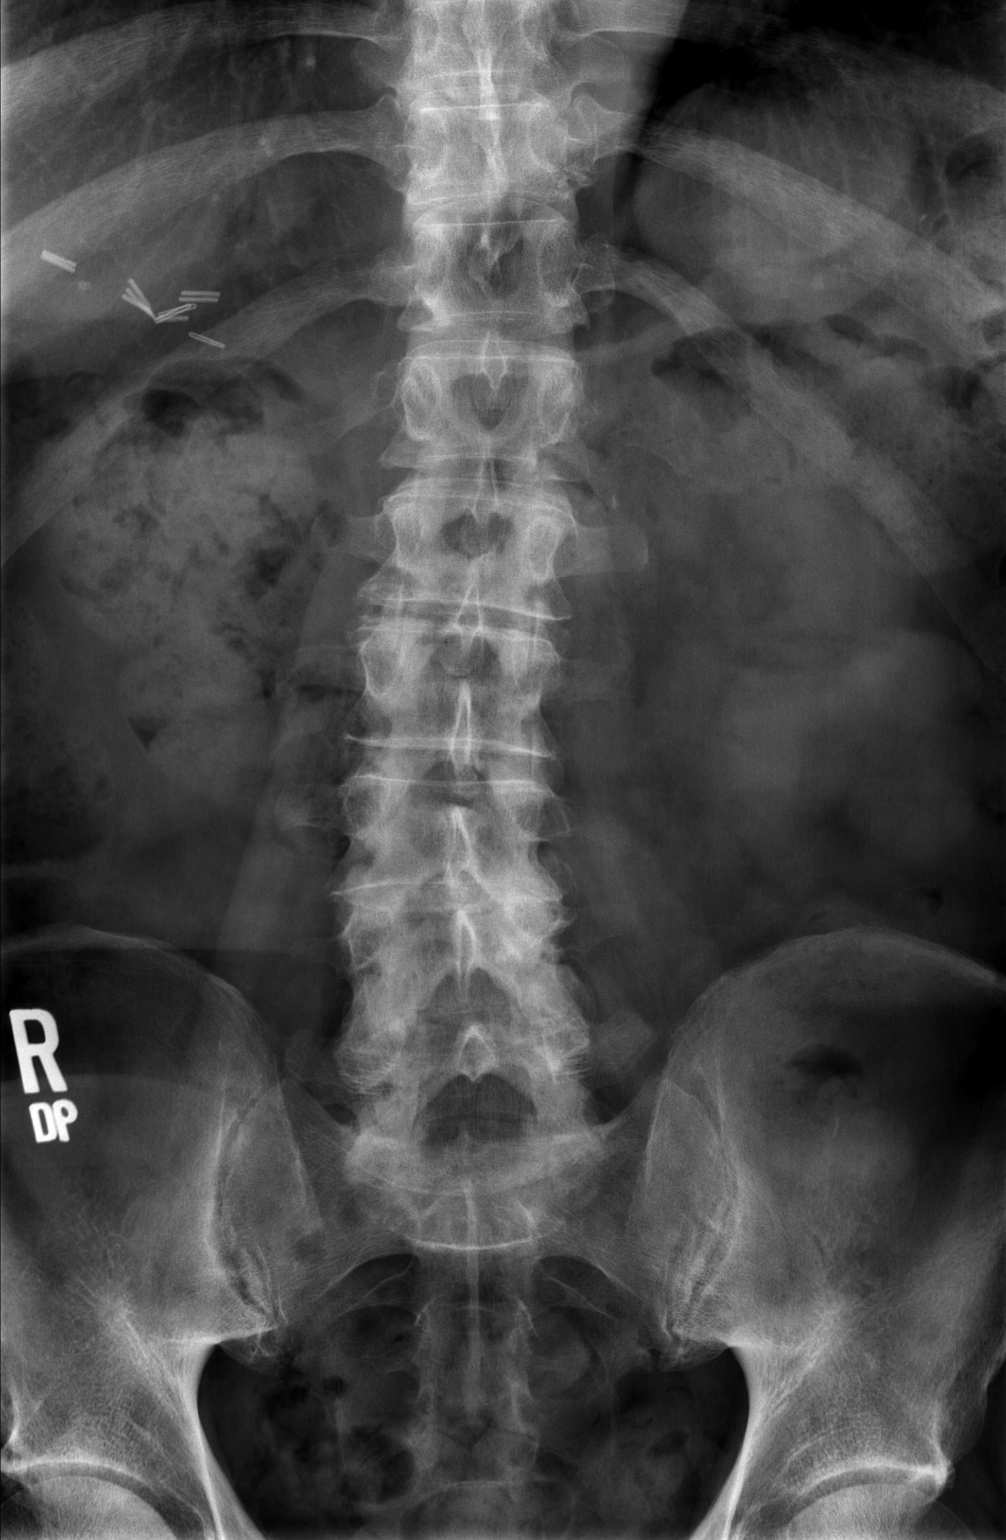

[t l-spine lat]
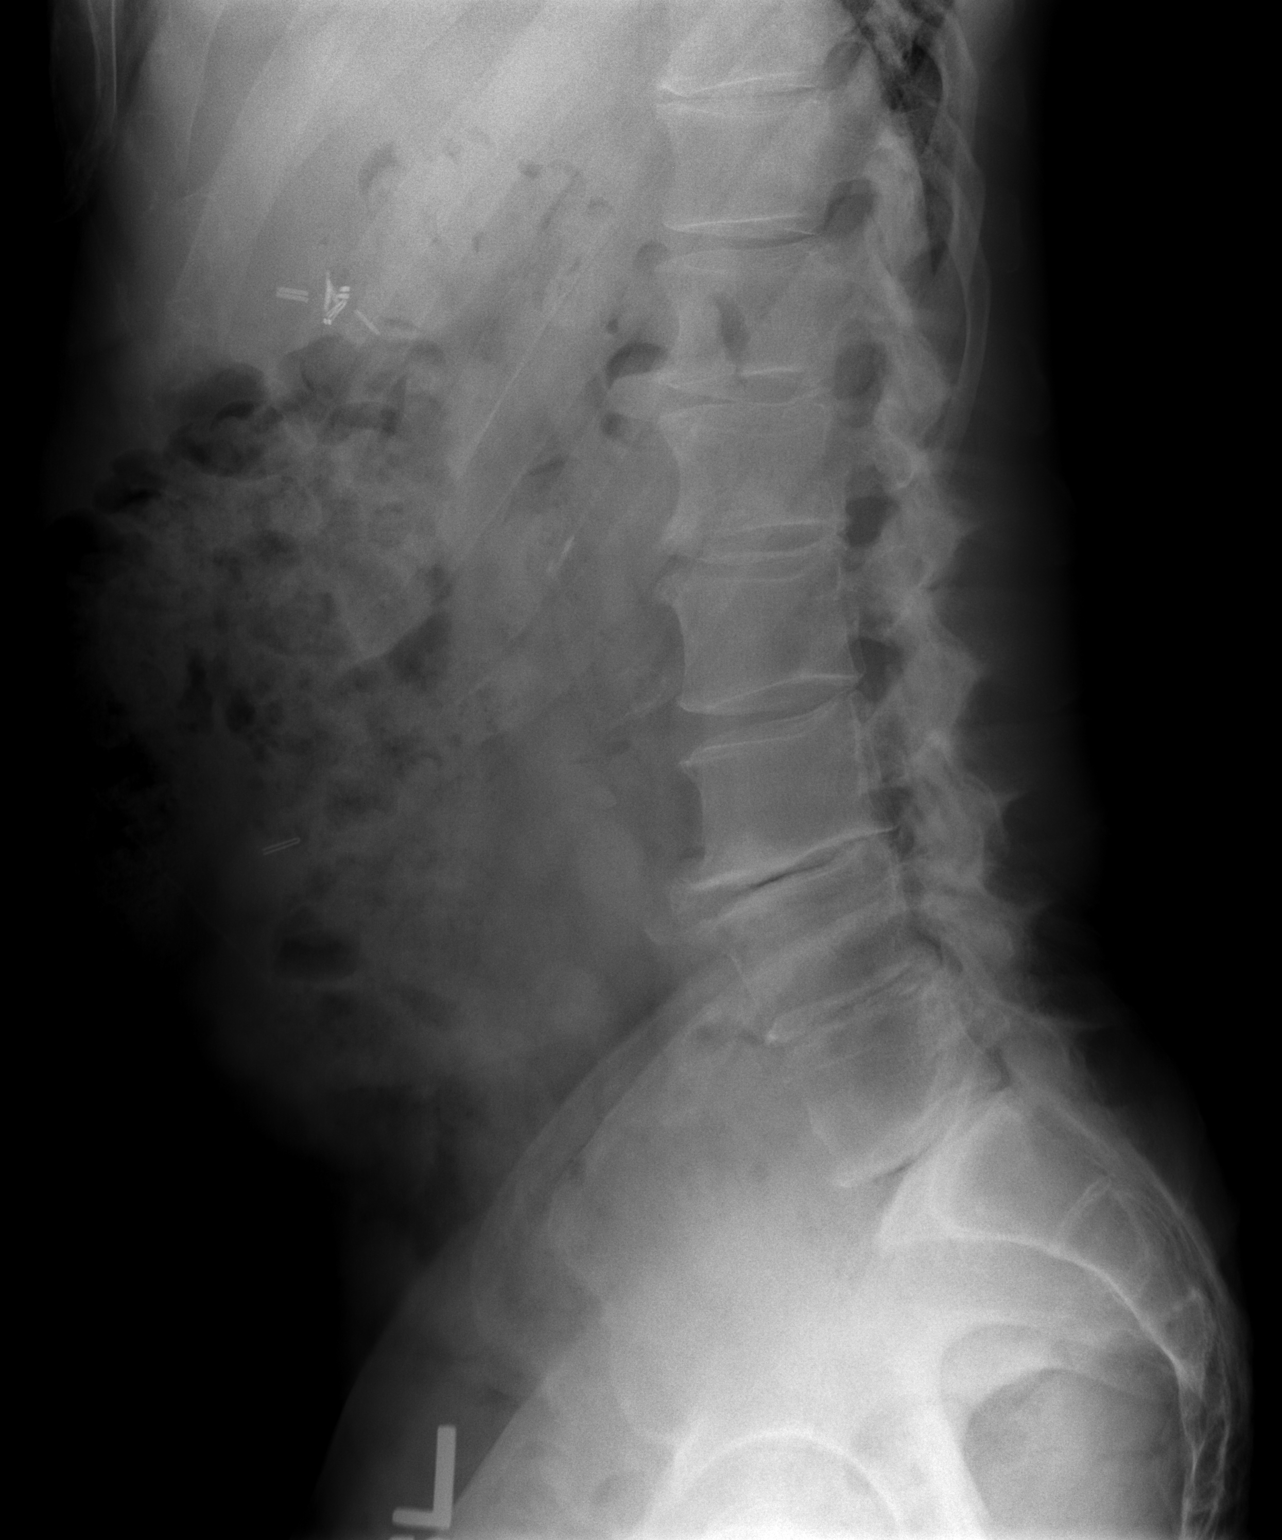

[t l-spine l5-s1 spot]
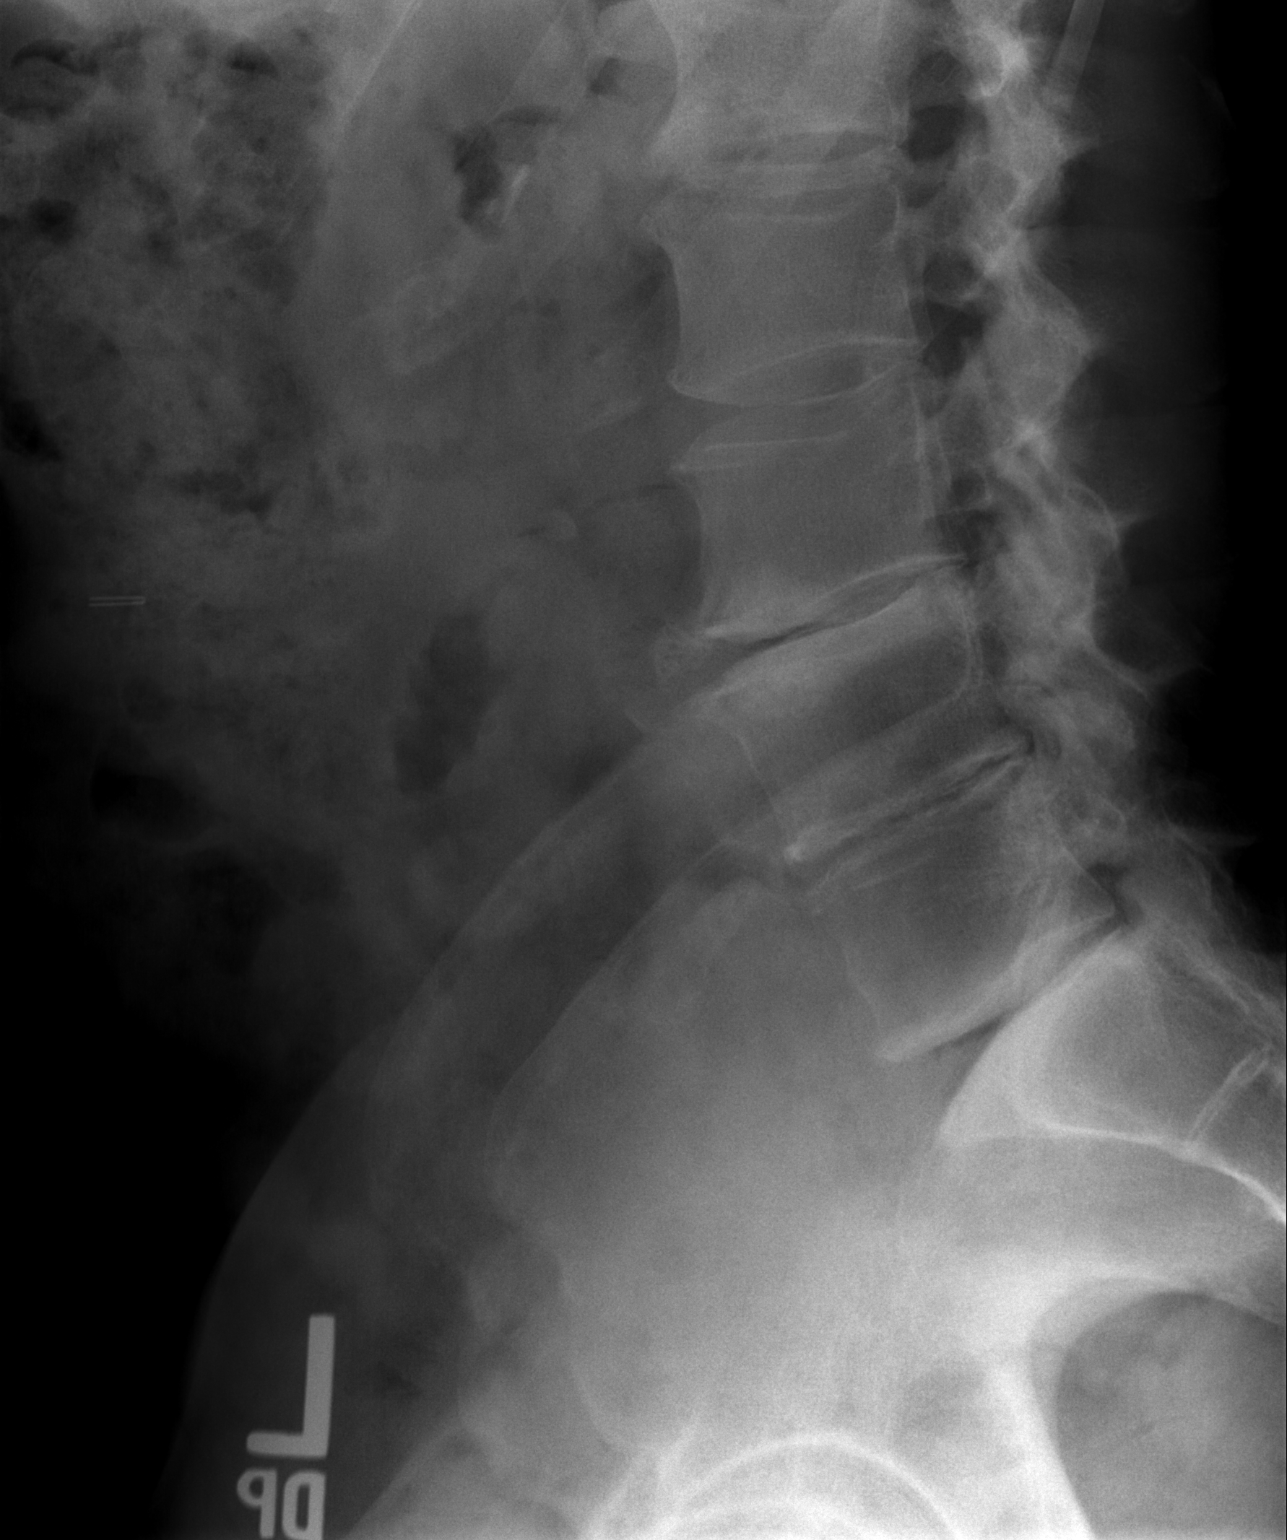

[3 of 3 positions shown; findings below may reference images not displayed]

FINDINGS: There is mild dextroscoliosis. No recent fracture is seen. Alignment
of posterior margins of vertebral bodies is unremarkable.
Degenerative changes are noted with disc space narrowing, bony spurs
and facet hypertrophy at multiple levels, more so from L3-S1 levels.
There is possible spinal stenosis and encroachment of neural
foramina in the lower lumbar spine. Surgical clips are seen in the
gallbladder fossa.
IMPRESSION: No recent fracture is seen in the lumbar spine. Lumbar spondylosis
with bony spurs, disc space narrowing, facet hypertrophy, spinal
stenosis and encroachment of neural foramina, especially severe from
L3-S1 levels.

## 2024-02-18 DIAGNOSIS — Z23 Encounter for immunization: Secondary | ICD-10-CM | POA: Diagnosis not present

## 2024-02-19 DIAGNOSIS — I10 Essential (primary) hypertension: Secondary | ICD-10-CM | POA: Diagnosis not present

## 2024-02-19 DIAGNOSIS — E1142 Type 2 diabetes mellitus with diabetic polyneuropathy: Secondary | ICD-10-CM | POA: Diagnosis not present

## 2024-02-19 DIAGNOSIS — E782 Mixed hyperlipidemia: Secondary | ICD-10-CM | POA: Diagnosis not present

## 2024-02-19 DIAGNOSIS — Z8546 Personal history of malignant neoplasm of prostate: Secondary | ICD-10-CM | POA: Diagnosis not present

## 2024-02-19 DIAGNOSIS — N183 Chronic kidney disease, stage 3 unspecified: Secondary | ICD-10-CM | POA: Diagnosis not present

## 2024-02-27 DIAGNOSIS — E1142 Type 2 diabetes mellitus with diabetic polyneuropathy: Secondary | ICD-10-CM | POA: Diagnosis not present

## 2024-02-27 DIAGNOSIS — N183 Chronic kidney disease, stage 3 unspecified: Secondary | ICD-10-CM | POA: Diagnosis not present

## 2024-02-27 DIAGNOSIS — I10 Essential (primary) hypertension: Secondary | ICD-10-CM | POA: Diagnosis not present

## 2024-03-12 ENCOUNTER — Other Ambulatory Visit: Payer: Self-pay | Admitting: Physician Assistant

## 2024-03-28 DIAGNOSIS — N183 Chronic kidney disease, stage 3 unspecified: Secondary | ICD-10-CM | POA: Diagnosis not present

## 2024-03-28 DIAGNOSIS — I10 Essential (primary) hypertension: Secondary | ICD-10-CM | POA: Diagnosis not present

## 2024-03-28 DIAGNOSIS — E1142 Type 2 diabetes mellitus with diabetic polyneuropathy: Secondary | ICD-10-CM | POA: Diagnosis not present

## 2024-04-07 NOTE — Progress Notes (Signed)
 MEM[facility] DISORD[facility]IN[facility]   Chief Complaint: Richard Torres is a 78 y.o. patient here for evaluation and treatment of  Encounter Diagnoses  Name Primary?  . MCI (mild cognitive impairment) Yes  . APOE*4/*4 genotype   . Alzheimer disease (CMS-HCC)   .  He is accompanied by wife. Primary caregiver is patient.   HPI:  Initial Evaluation Date: 12/05/23 Initial Evaluation Diagnosis: MCI    Initial Evaluation Pertinent Findings: ~1 year of progressive short term memory changes. AD8 rated at 5/8: repetition, trouble learning, finances, trouble remembering appointments, daily problems with memory and thinking. Excessive daytime somnolence.enactment behavior, no change in frequency. Still working as clinical research associate. Wife took over management of bills. She is unsure if he could handle that. Oldest son is a IT TRAINER, so he helps as well. Wife sets up medications. He administers independently. Notes anxiety around the cognitive changes. No VH/AH. A few falls in the past couple of years. His right leg swings out when he walks and trips him. This is longstanding. Lives with wife. Two sons in the area. Memantine  5mg  bid. Dose limited due to CKD. Tried donepezil, but discontinued due to AE.    Wife concerned about leaving him alone for any duration of time. Wife concerned he may get lost. No driving concerns. Wife has always been anxious with his driving   Paternal history of dementia attributed to ALS. Brother and sister with history of FTD. Oldest brother died in late 15s. Symptom onset in his late 21s. Sister with significant mental health history. Developed cognitive changes in her 30s. Mom developed cognitive changes late in life. Died at age 62.    Wife has concerns about hearing. No seizure history.  T2DM on metformin . HTN on telmisartan. On ASA81/statin after CVA/TIA.    Plan:  - Discussed amyloid PET scan to confirm blood testing. Discussed treatment with ATTs. If his APOE genotype is truly E4/E4, he would be  at increased risk of AE. Provided risk of AE based on APOE genotype. Will check APOE genotype today to confirm.  - Continue memantine . Dose limited due to CKD. AE with donepezil.  - On ASA/statin for secondary prevention given history of lacunar infarct  - Audiology for hearing.    12/28/2023 Follow Up Visit:  - Continue memantine . Dose limited due to CKD. AE with donepezil.  - On ASA/statin for secondary prevention given history of lacunar infarct  - Previously referred to audiology for hearing.    04/08/2024 Follow Up Visit: Visit today to review the results of testing and discuss treatment with ATTs.  Symptomatic treatments: memantine .       Current Outpatient Medications  Medication Sig Dispense Refill  . aspirin  81 mg tablet Take 162 mg by mouth once daily    . atorvastatin  (LIPITOR) 20 MG tablet Take 20 mg by mouth every evening    . cholecalciferol (VITAMIN D3) 1000 unit tablet Take 2,000 Units by mouth once daily    . fenofibrate  150 mg Cap Take 150 mg by mouth once daily    . memantine  (NAMENDA ) 5 MG tablet Take 1 tablet (5 mg at night) for 2 weeks, then increase to 1 tablet (5 mg) twice a day    . metFORMIN  (GLUCOPHAGE -XR) 500 MG XR tablet TAKE 2 TABLETS BY MOUTH EVERY MORNING AND 1 TABLET EVERY EVENING    . sertraline  (ZOLOFT ) 100 MG tablet 1 tablet Orally Once a day; Duration: 90 days    . telmisartan (MICARDIS) 40 MG tablet Take 40 mg by mouth  once daily     No current facility-administered medications for this visit.    Allergies as of 04/08/2024 - Reviewed 01/04/2024  Allergen Reaction Noted  . Donepezil hcl Other (See Comments) 04/13/2023  . Shellfish containing products Dizziness and Other (See Comments) 07/16/2011    No past medical history on file.   Physical Exam  General: Well appearing. In no distress. HEENT: Normocephalic. Atraumatic. Anicteric sclerae.  Pulmonary:  Breathing comfortably on room air.   Cognitive Testing Montreal Cognitive Assessment  (MoCA)     12/05/2023   10:22 AM  MOCA  Trails 0  Cube 1  Clock 3  Naming 3  Digit Span 2  Letter A 1  Serial 7s 3  Sentence Repetition 2  Fluency 1  Abstraction 2  Orientation 5  Memory 0  Education level 0  Total Score 23  Acutal or Adjusted Denominator 30     Brain Imaging:    Results for orders placed during the hospital encounter of 01/04/24  MRI brain without contrast  Narrative MRI BRAIN WITHOUT CONTRAST  INDICATION: Memory Loss, 3T MRI, G31.84 Mild cognitive impairment of uncertain or unknown etiology  COMPARISON: April 18, 2023  TECHNIQUE/PROTOCOL: Noncontrast [facility] Memory protocol brain MRI was performed. Neuroquant analysis: Automated volumetric analysis was performed with NeuroQuant Software. Percentiles listed account for age and gender.   FINDINGS:  Atrophy Pattern (Visual assessment) Global volume loss: Age-appropriate Disproportionate lobar atrophy: none Ventricles and Sulci: Normal Hippocampi: Symmetric in morphology, volume, and signal. Atrophy is disproportionate with overall degree of parenchymal volume loss.  Atrophy Pattern (NeuroQuant) Hippocampi: 1 % Superior Lateral ventricles: 55 % Inferior lateral ventricle: 5 % Hippocampal Occupancy Score (percentile range): 34 percentile Lobar atrophy based on quantitative report: No  Cerebrovascular Cortical infarcts: None Lacunar infarcts: none Chronic microangiopathy changes: Fazekas grade (0-3 scale): 1 Microhemorrhage (SWI sequence): 1 ; left frontal series 14 image 72, 73. Superficial siderosis: none  General Brain Parenchyma:  No hemorrhage, cerebral edema, acute cortical infarction, mass, mass effect, or midline shift. Age-related changes. Mild volume loss, scattered nonspecific white matter signal changes. Extra-Axial Spaces: No extra-axial fluid collection. Basal Cisterns: Normal. Intracranial Flow-Voids: Normal.  Paranasal Sinuses: Normal. Mastoids: Normal. Orbits:  Cataract surgery. Cranium: No osseous lesions. Visualized upper cervical spine: No high grade stenosis.  Impression 1.  Hippocampal volume at single percentile. Occupancy score 34%. Single left frontal microhemorrhage. Fazekas 1. Mild general volume loss without quantitative lobar atrophy. 2.  Volumetric assessment of atrophy is concordant with visual impression.    **NOTE: Please note that automated quantitative brain measurements are approximate. Several factors may affect the reliability of these measurements, including inter-scanner variability in image quality and image artifacts such as patient motion. Interpretation of volumetric analysis should take these factors into account, and should not supersede the Radiologist's qualitative assessment of brain volume as communicated in the report impression. Also, note that the number of microhemorrhages reported may be dependent on technical factors such as GRE/SWI, slice thickness, and field strength.   Examination independently interpreted by staff radiologist.  Electronically Signed by:  Lamar JAYSON Lund, MD, [facility] Electronically Signed on:  01/11/2024 11:30 AM  Biomarker Results:   AD Biomarkers:  07/02/23 [facility] AD-Detect Ab42/Ab40 Ratio = 0.114 (nl >0.170)  eUjl782 = 0.48 (<0.15 pg/mL)  pTau181 = 1.53 (nl <1.07 pg/mL)  Plasma NfL: 2.99 pg/mL (<12.52 pg/mL)  02/06/24 amyloid PET scan consistent with AD. CS=84.2.  APOE Genotype: E4/E4   Assessment & Plan  Problem List Items Addressed This  Visit       Neurology/Sleep   MCI (mild cognitive impairment) - Primary   Alzheimer disease (CMS-HCC)     Genetics   APOE*4/*4 genotype     ASSESSMENT  Cognitive Functional Status: MCI Cognitive Behavioral Syndrome: Progressive amnestic syndrome  Suspected Etiology: Alzheimer's disease Supportive Imaging/Biomarker Findings:  07/02/23 [facility] AD-Detect consistent with AD  04/21/23 OSH MRI read as normal. No infarct  identified despite history of lacunar infarct. No MH or SS on SWI.  Contributing Factors: APOE-E4/E4, hearing loss, T2DM  PLAN  - Discussed expected benefits (modest slowing of progression of cognitive symptoms) and risk of AE (brain swelling, brain bleeding, disability, and death) with lecanemab/donanemab treatment based on APOE genotype and MRI. Patient still deciding if he wants to proceed.  - Continue memantine . Dose limited due to CKD. AE with donepezil.  - On ASA/statin for secondary prevention given history of lacunar infarct  - Previously referred to audiology for hearing.  - History of depression/anxiety. Continue sertraline .   Functional Status, Advanced Care Planning, & Safety Concerns:  - No functional limitations - Decision-Making Capacity: After obtaining the history from the patient and informant, reviewing the cognitive testing and discussing the patient's understanding of their impairment and limitations, it is my opinion that even though the patient has some cognitive abnormalities they still have full insight and judgement to conduct their financial, legal and medical affairs.  - Advanced Care Planning: If not already completed, patient and family were provided with information on how to complete advanced care planning, including a website with example forms.   Follow Up: TBD based on decision re: ATTs   ORDERS  1. MCI (mild cognitive impairment)  2. APOE*4/*4 genotype  3. Alzheimer disease (CMS-HCC)      Patient was instructed to increase exercise 30 to 45 minutes per day, engage in intellectual stimulation, social activities, follow a Mediterranean diet, limit alcohol intake, if using tobacco, stop smoking or using tobacco products, and avoid naps to prevent poor sleep at night.  Neuropsychiatric symptoms and treatment plan addressed. Advanced care pan addressed.   Medication, side effects and interactions were discussed.  My information was given and patient  and/or family instructed to call or use MyChart for questions and to obtain lab results or to report effect of medication.    For emergencies, patient and/or family instructed to report to the nearest emergency room.    Please reach out to memory clinic providers via e-consult for concerns that arise.  This video encounter was conducted with the patient's (or proxy's) verbal consent via secure, interactive audio and video telecommunications while in clinic/office/hospital.  The patient (or proxy) was instructed to have this encounter in a suitably private space and to only have persons present to whom they give permission to participate. In addition, patient identity was confirmed by use of name plus an additional identifier.  May 08, 2020 E&M) This visit was coded based on time. I spent a total of 25 minutes in both face-to-face and non-face-to-face activities for this visit on the date of this encounter.   04/08/2024  For patients and caregivers: You now have access to your medical records including your physician's documentation via [facility] My[facility]. Please be aware that your physician's notes are primarily designed to (1) record information pertinent to your care for the future reference of your physician and (2) act as inter-professional communication between members of your medical team. You should rely on the After Visit Summary (AVS) provided to you at the end of  your visit for information about your visit and instructions regarding next steps. This is also available to you in [facility]. If you have clarifications or corrections you would like made to your records, please send a message via MyChart. This will automatically be included in your medical record.  Patient Instructions  How are Lecanemab and Donanemab different?   Lecanemab and donanemab are both antibodies that bind to the amyloid protein that builds up in the brain in Alzheimer's disease. These drugs trigger an immune response in the  brain to get rid of the amyloid protein. We will pick one or the other medications based on your risk of side effects and your preferences.    Lecanemab Donanemab  Where are infusions given?  [facility]  How often are infusions? Every 14 days Every 28 days  Do I need someone with me for the infusion?  For the first 5 infusions For the first 5 infusions  How often do I need an MRI?  After the 2nd, 4th, 6th, and 13th infusions After the 1st, 2nd, 3rd, and 6th infusions  How long will I be on treatment?  At least 18 months At least 12 months   What happens after 18 months of treatment with lecanemab or 12 months of treatment with donanemab?   After 18 months of treatment with lecanemab or 12 months of treatment with donanemab, your provider may recommend that you have an amyloid PET scan to determine if the amyloid protein has been completely removed by the drug. If the amyloid protein is no longer visible on the amyloid PET scan, your provider may recommend that you stop treatment or switch to less frequent maintenance dosing. If the amyloid protein is still visible on the amyloid PET scan, your provider may recommend that you continue treatment for an additional period of time.   What are the expected benefits of treatment?   The benefits of treatment likely depend on how early in the disease process we start treatment. People who begin treatment earlier in the disease process with milder symptoms will likely have a greater benefit compared to people who begin treatment with more advanced symptoms. If you were diagnosed with mild cognitive impairment, you will likely benefit more from treatment than if you were diagnosed with mild dementia.   The reason for this is that Alzheimer's disease is defined as the build up of two proteins in the brain. The first protein that builds up is amyloid. As amyloid builds up in the brain, it triggers the build up of another  protein called tau. It is the tau protein that causes much of the damage to the brain and leads to the memory and thinking changes we see in Alzheimer's disease. If we use these medications to get ride of the amyloid before there is a lot of tau in the brain, there will be a greater benefit. If there is already a lot of tau in the brain, getting rid of the amyloid will not help as much. Unfortunately, we do not have a reliable way to measure how much tau someone has in their brain at this time.   In the clinical trial testing lecanemab, the drug slowed down changes in memory and thinking by around 27% over 18 months. For people who started the drug with lower amounts of tau in the brain, the drug had a greater benefit, possibly slowing changes by around 51% over 18 months. In the clinical trial testing donanemab,  the drug slowed down changes in memory and thinking by around 22% over 18 months. For people who started the drug with lower amounts of tau in the brain, the drug slowed down changes in memory and thinking by around 35% over 18 months. Based on these numbers, it may seem like lecanemab was more effective. However, the participants in the donanemab trial had more advanced symptoms when they started treatment compared to those in the lecanemab trial.  Risk of Side Effects with Lecanemab & Donanemab for APOE-E4/E4  The risk of side effects (brain swelling and bleeding) with lecanemab and donanemab are increased in people who have one or two copies of the APOE-E4 genetic variant. Based on your APOE genotype, you have two copies of the APOE-E4 variant.   In the donanemab clinical trial, the drug had a much higher risk of side effects compared to lecanemab. The drug company conducted another, smaller trial that used a modified dosing schedule where patients were started on a lower dose of donanemab and it was increased more slowly with each infusion. This appeared to reduce the risk of side effects. The  trial using the modified dosing schedule was smaller than the original trial, so the risk of side effects may be higher than what was reported. The risk of side effects using the original dosing schedule and the modified dosing schedule are shown below.    Lecanemab Donanemab    Original Dosing Modified Dosing  Infusion Reaction 26.4% 9% 9%  Risk of Brain Swelling 32.6% 40.6% 19.0%  Risk of Brain Swelling & Symptoms 9.2% Not Reported 0%  Risk of Brain Bleeding (mostly small bleeds) 39.0% 50.3% 28.6%  Risk of Larger Brain Bleed 2 out of 141 people None None  How many deaths occurred in the clinical trial?  No deaths* No Deaths No deaths   * While there were no deaths in the main clinical trial for lecanemab, there were three deaths after the trial in trial participants who opted to continue treatment with lecanemab.   The numbers in this table are the percentages of people in each group who developed the side effect. One way to interpret these numbers is to imagine that if 100 people with the APOE-E4/E4 genotype received the drug, then the percentage is the number of people who experienced the side effect. For example, with lecanemab, 32.6% of people with the APOE-E4/E4 genotype experienced brain swelling. That means that if 100 people with the APOE-E4/E4 genotype received lecanemab, around 33 would develop brain swelling.   Infusion reaction side effects are typically mild symptoms like fever, chills, headache, body aches that can occur during or immediately following the first couple of infusions. These symptoms typically go away completely after a few infusions.   Risk of brain swelling and brain bleeding is highest in the first six months of treatment. We do several MRIs during the first ~6 months of treatment to look for these side effects. If we see evidence of swelling or bleeding, depending on the severity and whether there are symptoms, we may pause treatment or stop treatment completely. If  these side effects occur, they tend to occur once and not occur again. Many people can safely resume treatment after an episode of side effects.  Lecanemab and donanemab can cause brain bleeding. Typically, these are small areas of bleeding (microhemorrhages). These drugs can also cause larger areas of bleeding (superficial siderosis and intracerebral hemorrhage), but this was rare. It's important to note that in the clinical trials, people  in the placebo group (group that did not receive the drug) also developed small areas of bleeding as this can happen as part of Alzheimer's disease. In the lecanemab study, 4.2% of people in the placebo group developed brain bleeding, typically small areas of bleeding (microhemorrhages). In the donanemab study, 11.2% of people in the placebo group developed brain bleeding, typically small areas of bleeding (microhemorrhages).   If you would like to proceed with treatment, the next step would be to set up an in person consent visit. Between the two drugs, lecanemab and donanemab, lecanemab is likely the safer option for someone with two copies of the APOE-E4 variant.

## 2024-04-08 DIAGNOSIS — G309 Alzheimer's disease, unspecified: Secondary | ICD-10-CM | POA: Diagnosis not present

## 2024-04-08 DIAGNOSIS — F0280 Dementia in other diseases classified elsewhere without behavioral disturbance: Secondary | ICD-10-CM | POA: Diagnosis not present

## 2024-04-08 DIAGNOSIS — Z1589 Genetic susceptibility to other disease: Secondary | ICD-10-CM | POA: Diagnosis not present

## 2024-04-08 DIAGNOSIS — G3184 Mild cognitive impairment, so stated: Secondary | ICD-10-CM | POA: Diagnosis not present

## 2024-04-08 NOTE — Patient Instructions (Signed)
 How are Lecanemab and Donanemab different?   Lecanemab and donanemab are both antibodies that bind to the amyloid protein that builds up in the brain in Alzheimer's disease. These drugs trigger an immune response in the brain to get rid of the amyloid protein. We will pick one or the other medications based on your risk of side effects and your preferences.    Lecanemab Donanemab  Where are infusions given?  [facility]  How often are infusions? Every 14 days Every 28 days  Do I need someone with me for the infusion?  For the first 5 infusions For the first 5 infusions  How often do I need an MRI?  After the 2nd, 4th, 6th, and 13th infusions After the 1st, 2nd, 3rd, and 6th infusions  How long will I be on treatment?  At least 18 months At least 12 months   What happens after 18 months of treatment with lecanemab or 12 months of treatment with donanemab?   After 18 months of treatment with lecanemab or 12 months of treatment with donanemab, your provider may recommend that you have an amyloid PET scan to determine if the amyloid protein has been completely removed by the drug. If the amyloid protein is no longer visible on the amyloid PET scan, your provider may recommend that you stop treatment or switch to less frequent maintenance dosing. If the amyloid protein is still visible on the amyloid PET scan, your provider may recommend that you continue treatment for an additional period of time.   What are the expected benefits of treatment?   The benefits of treatment likely depend on how early in the disease process we start treatment. People who begin treatment earlier in the disease process with milder symptoms will likely have a greater benefit compared to people who begin treatment with more advanced symptoms. If you were diagnosed with mild cognitive impairment, you will likely benefit more from treatment than if you were diagnosed with mild dementia.    The reason for this is that Alzheimer's disease is defined as the build up of two proteins in the brain. The first protein that builds up is amyloid. As amyloid builds up in the brain, it triggers the build up of another protein called tau. It is the tau protein that causes much of the damage to the brain and leads to the memory and thinking changes we see in Alzheimer's disease. If we use these medications to get ride of the amyloid before there is a lot of tau in the brain, there will be a greater benefit. If there is already a lot of tau in the brain, getting rid of the amyloid will not help as much. Unfortunately, we do not have a reliable way to measure how much tau someone has in their brain at this time.   In the clinical trial testing lecanemab, the drug slowed down changes in memory and thinking by around 27% over 18 months. For people who started the drug with lower amounts of tau in the brain, the drug had a greater benefit, possibly slowing changes by around 51% over 18 months. In the clinical trial testing donanemab, the drug slowed down changes in memory and thinking by around 22% over 18 months. For people who started the drug with lower amounts of tau in the brain, the drug slowed down changes in memory and thinking by around 35% over 18 months. Based on these numbers, it may seem like lecanemab was  more effective. However, the participants in the donanemab trial had more advanced symptoms when they started treatment compared to those in the lecanemab trial.  Risk of Side Effects with Lecanemab & Donanemab for APOE-E4/E4  The risk of side effects (brain swelling and bleeding) with lecanemab and donanemab are increased in people who have one or two copies of the APOE-E4 genetic variant. Based on your APOE genotype, you have two copies of the APOE-E4 variant.   In the donanemab clinical trial, the drug had a much higher risk of side effects compared to lecanemab. The drug company conducted  another, smaller trial that used a modified dosing schedule where patients were started on a lower dose of donanemab and it was increased more slowly with each infusion. This appeared to reduce the risk of side effects. The trial using the modified dosing schedule was smaller than the original trial, so the risk of side effects may be higher than what was reported. The risk of side effects using the original dosing schedule and the modified dosing schedule are shown below.    Lecanemab Donanemab    Original Dosing Modified Dosing  Infusion Reaction 26.4% 9% 9%  Risk of Brain Swelling 32.6% 40.6% 19.0%  Risk of Brain Swelling & Symptoms 9.2% Not Reported 0%  Risk of Brain Bleeding (mostly small bleeds) 39.0% 50.3% 28.6%  Risk of Larger Brain Bleed 2 out of 141 people None None  How many deaths occurred in the clinical trial?  No deaths* No Deaths No deaths   * While there were no deaths in the main clinical trial for lecanemab, there were three deaths after the trial in trial participants who opted to continue treatment with lecanemab.   The numbers in this table are the percentages of people in each group who developed the side effect. One way to interpret these numbers is to imagine that if 100 people with the APOE-E4/E4 genotype received the drug, then the percentage is the number of people who experienced the side effect. For example, with lecanemab, 32.6% of people with the APOE-E4/E4 genotype experienced brain swelling. That means that if 100 people with the APOE-E4/E4 genotype received lecanemab, around 33 would develop brain swelling.   Infusion reaction side effects are typically mild symptoms like fever, chills, headache, body aches that can occur during or immediately following the first couple of infusions. These symptoms typically go away completely after a few infusions.   Risk of brain swelling and brain bleeding is highest in the first six months of treatment. We do several MRIs  during the first ~6 months of treatment to look for these side effects. If we see evidence of swelling or bleeding, depending on the severity and whether there are symptoms, we may pause treatment or stop treatment completely. If these side effects occur, they tend to occur once and not occur again. Many people can safely resume treatment after an episode of side effects.  Lecanemab and donanemab can cause brain bleeding. Typically, these are small areas of bleeding (microhemorrhages). These drugs can also cause larger areas of bleeding (superficial siderosis and intracerebral hemorrhage), but this was rare. It's important to note that in the clinical trials, people in the placebo group (group that did not receive the drug) also developed small areas of bleeding as this can happen as part of Alzheimer's disease. In the lecanemab study, 4.2% of people in the placebo group developed brain bleeding, typically small areas of bleeding (microhemorrhages). In the donanemab study, 11.2% of people in  the placebo group developed brain bleeding, typically small areas of bleeding (microhemorrhages).   If you would like to proceed with treatment, the next step would be to set up an in person consent visit. Between the two drugs, lecanemab and donanemab, lecanemab is likely the safer option for someone with two copies of the APOE-E4 variant.

## 2024-04-09 ENCOUNTER — Ambulatory Visit: Admitting: Physician Assistant

## 2024-04-14 NOTE — Progress Notes (Signed)
 "   Assessment & Plan Mild cognitive impairment with progressive memory loss due to Alzheimer's disease  Progressive memory loss with worsening short-term memory. Difficulty with computer navigation and repeating appointments. No wandering behavior or personality changes. Stress and mood changes related to work. Most recent MRI shows hippocampal atrophy consistent with Alzheimer's pathology. APOE4 homozygous genotype increases risk for ARIA with anti-amyloid therapies. Patient and wife decided to avoid anti-amyloid therapies due to risk of brain bleeds and limited benefits. Emphasis on non-pharmaceutical interventions such as Mediterranean diet and cognitive training.  - Continue memantine  5 mg oral twice a day. - Follow Mediterranean diet and cognitive training program. - Monitor blood pressure and cholesterol levels. -Patient to follow up at [facility]   Depression Situational depression related to work stress. No worsening of depression when away from work. No hallucinations or significant sleep changes. - Continue current management and monitor mood changes.  Type 2 diabetes mellitus Limited dietary variety due to diabetes management. No issues with appetite or swallowing. - Continue current diabetes management plan.     Discussed the use of AI scribe software for clinical note transcription with the patient, who gave verbal consent to proceed.  History of Present Illness Richard Torres is a very pleasant 78 y.o. RH male with a history of litigating attorney with a history of hypertension, hyperlipidemia, DM2 with neuropathy,history of RLE DVT, CKD, GAD, remote anterior medial left temporal lobe tiny infarct only seen in MRI in 2011, vitamin D  deficiency  and a diagnosis of mild cognitive impairment due to Alzheimer's disease presenting today in follow-up for evaluation of memory loss.  Since his last visit on May 2025 with MoCA 26/30, the patient has been seen at [facility] neurology, Dr.  Kennyth, for second opinion after extensive testing listed below, but decided against these med. Richard Torres is a 78 year old male with Alzheimer's disease who presents with worsening memory issues. He is accompanied by his wife, Richard Torres.   He has experienced a gradual worsening of short-term memory since his last visit on Oct 08, 2023. He struggles with following plots in movies, although he can still follow books. He faces challenges with computer use, such as navigating emails, and often repeats himself regarding appointments. No wandering tendencies or significant personality changes are noted, though he feels more stressed, particularly related to work.  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 He experiences situational depression related to his job but feels fine when away from work. No hallucinations are reported, and there are no significant changes in sleep patterns, though he has lifelong occasional vivid dreams.  He has a history of a fall while running due to a drop foot, which occasionally causes him to trip. He experiences chronic low back pain, for which physical therapy was tried without benefit, but he remains active by regularly going to the gym. No recent tremors, changes in smell, urinary incontinence, or bowel incontinence are reported, though he experiences chronic diarrhea due to gallbladder disease.  He is currently taking a low dose of memantine  (5 mg) due to kidney issues and is unable to tolerate donepezil due to gastrointestinal symptoms. His wife manages his medications. He is no longer on blood pressure medication but continues to take aspirin . He reports a limited diet due to diabetes, eating small portions without difficulty  swallowing.   No current vision changes, major headaches, or pain requiring  medication are reported. He drives during the day but finds night driving more challenging.       MRI brain 04/18/2023 personally reviewed negative for acute changes, mild chronic small vessel disease essentially unchanged from prior MRI and mild atrophy seen. No hippocampal atrophy is noted.    Plasma biomarkers 06/2023: pTau 181 1.53 and AB   42-40 ratio 0.114,  pTAU 217 0.46, NF Light chain 2.99     Neuropsych evaluation 06/05/2023 Briefly, results suggested significant impairment surrounding most aspects of learning and memory. Some variability was seen across retention percentages; however, normative performances were quite poor across encoding (i.e., learning), retrieval, and recognition/consolidation aspects of memory given premorbid intellectual estimations. An isolated impairment was also exhibited across a working memory task, while variability was exhibited across executive functioning. significant impairment surrounding most aspects of learning and memory. Some variability was seen across retention percentages; however, normative performances were quite poor across encoding (i.e., learning), retrieval, and recognition/consolidation aspects of memory given premorbid intellectual estimations. An isolated impairment was also exhibited across a working memory task, while variability was exhibited across executive functioning. I continue to be unable to rule out the very early beginnings of a neurodegenerative illness such as Alzheimer's disease. However, memory as whole did not exhibit consistent decline and other non-memory areas commonly implicated in this illness (e.g., semantic fluency, confrontation naming) were strong and stable across both evaluations.    Initial visit 06/12/22 How long did patient have memory difficulties?  For about 4-5 years  Patient reports some difficulty remembering new information, recent conversations, names. I always was very good with numbers.  LTM is great. Has  more difficulty with technology such as cells and remote control. His wife reports that there may be a component of ADD which was never diagnosed. HE reports that may have underlying dyslexia as well, although not sure. repeats oneself?  Endorsed, especially with appointments. Disoriented when walking into a room?  Patient denies    Leaving objects in unusual places?  Denies.   Wandering behavior? Denies.   Any personality changes, or depression, anxiety? Denies  Hallucinations or paranoia? denies   Seizures? denies    Any sleep changes? Sleeps well since decreasing the work load.  Reports stressful vivid dreams at times, talks in his sleep, and moves frequently at night. Denies sleepwalking   Sleep apnea? Denies.   Any hygiene concerns?  Denies.   Independent of bathing and dressing? Endorsed  Does the patient need help with medications?  Wife is in charge, but wife states he can do it by himself.    Who is in charge of the finances?  Son is in charge who is a IT TRAINER     Any changes in appetite?   Denies. Eats well.     Patient have trouble swallowing?  Denies.   Does the patient cook? No.  Any headaches?  Denies.   Chronic pain?  Endorsed, especially at neck and back, but eased up over the years after surgery for ruptured disk Ambulates with difficulty? Denies, uses the treadmill, climbs steps to lower the sugars. Has a mini gym at the garage. Recent falls or head injuries?  In 2022 he fell on the concrete hitting his head, he denies any loss of consciousness at the time.    Vision changes?  Denies any new issues.  Has a history of color blindness Any strokelike symptoms? Denies.   Any tremors?  Denies.  Any anosmia? He does have decreased taste and smell  Any incontinence of urine? Denies.   Any bowel dysfunction? Chronic diarrhea issues       Patient lives with wife  History of heavy alcohol intake? Denies.   History of heavy tobacco use? Denies.   Family history of dementia?   Father  with dementia but he had ALS, Brother FTD, sister with Pick's disease, mother had senile dementia died at 64   Does patient drive?  Yes, but I have never been good with directions  Litigating attorney., encounters more difficulty but everything changed, everything I faster.     Quantitative MRI of the brain at  [facility] August 2025 hippocampal volume at single percentile. Occupancy score 34%. Single left frontal microhemorrhage. [place] 1. Mild general volume loss without quantitative lobar atrophy.2. Volumetric assessment of atrophy is concordant with visual impression.   Biomarker Results:   AD Biomarkers:  07/02/23 [facility] AD-Detect Ab42/Ab40 Ratio = 0.114 (nl >0.170)  eUjl782 = 0.48 (<0.15 pg/mL)  pTau181 = 1.53 (nl <1.07 pg/mL)  Plasma NfL: 2.99 pg/mL (<12.52 pg/mL)  02/06/24 amyloid PET scan consistent with AD. CS=84.2.  APOE Genotype: E4/E4   Past Medical History:  Diagnosis Date   Acute embolism and thrombosis of unspecified deep veins of unspecified lower extremity    Asymptomatic varicose veins of bilateral lower extremities    Chronic kidney disease, stage 3a    Chronic pain    Clotting disorder    hx clot in left leg/right leg   DJD (degenerative joint disease)    ED (erectile dysfunction) of organic origin    Elevated liver enzymes 07/21/2011   Environmental allergies    Essential hypertension    Generalized anxiety disorder    Headache 07/21/2011   Hematuria syndrome    Herniated lumbar disc without myelopathy 03/08/2020   Hypoglycemia due to type 2 diabetes mellitus    Incisional hernia, without obstruction or gangrene 02/10/2013   Insomnia    Lacunar infarction 2011   2011 MRI - Tiny acute infarct anterior medial left temporal lobe     Low back pain    Major depressive disorder    Malignant tumor of prostate    Mild cognitive impairment with memory loss 08/19/2021   Mixed hyperlipidemia    Motion sickness    Normocytic anemia 07/21/2011   Pharyngitis     Polyneuropathy due to type 2 diabetes mellitus    Polyp of colon    Renal insufficiency 07/21/2011   Sciatica    Splenomegaly 07/21/2011   Type II diabetes mellitus    Umbilical hernia    Vitamin D  deficiency      Past Surgical History:  Procedure Laterality Date   bilateral inguinal heria repairs     cholecystectomy     CHOLECYSTECTOMY     COLONOSCOPY     EYE SURGERY Bilateral    cataracts removed   HERNIA REPAIR     LUMBAR LAMINECTOMY/ DECOMPRESSION WITH MET-RX Right 03/08/2020   Procedure: Right Lumbar Five Sacral OneExtraforaminal microdiscectomy;  Surgeon: Unice Pac, MD;  Location: [facility] OR;  Service: Neurosurgery;  Laterality: Right;   PROSTATECTOMY     VARICOSE VEIN SURGERY Bilateral    laser - legs      Results      Objective:     PHYSICAL EXAMINATION:    VITALS:   Vitals:   04/15/24 0929  BP: 104/71  Pulse: 72  Resp: 20  SpO2: 98%  Weight: 177 lb (80.3  kg)    GEN:  The patient appears stated age and is in NAD. HEENT:  Normocephalic, atraumatic.   Neurological examination:  General: NAD, well-groomed, appears stated age. Orientation: The patient is alert. Oriented to person, place and not to date.  Cranial nerves: There is good facial symmetry.The speech is fluent and clear. No aphasia or dysarthria. Fund of knowledge is appropriate. Recent memory impaired and remote memory is normal.  Attention and concentration are normal.  Able to name objects and repeat phrases.  Hearing is intact to conversational tone . Sensation: Sensation is intact to light touch throughout Motor: Strength is at least antigravity x4. DTR's 2/4 in UE/LE      04/13/2023   12:00 PM  Montreal Cognitive Assessment   Visuospatial/ Executive (0/5) 5  Naming (0/3) 3  Attention: Read list of digits (0/2) 2  Attention: Read list of letters (0/1) 1  Attention: Serial 7 subtraction starting at 100 (0/3) 3  Language: Repeat phrase (0/2) 1  Language : Fluency (0/1) 1   Abstraction (0/2) 1  Delayed Recall (0/5) 3  Orientation (0/6) 6  Total 26  Adjusted Score (based on education) 26        No data to display             Movement examination: Tone: There is normal tone in the UE/LE Abnormal movements:  no tremor.  No myoclonus.  No asterixis.   Coordination:  There is no decremation with RAM's. Normal finger to nose  Gait and Station: The patient has no difficulty arising out of a deep-seated chair without the use of the hands. The patient's stride length is good.  Gait is cautious and narrow.   Thank you for allowing us  the opportunity to participate in the care of this nice patient. Please do not hesitate to contact us  for any questions or concerns.   Total time spent on today's visit was 33 minutes dedicated to this patient today, preparing to see patient, examining the patient, ordering tests and/or medications and counseling the patient, documenting clinical information in the EHR or other health record, independently interpreting results and communicating results to the patient/family, discussing treatment and goals, answering patient's questions and coordinating care.  Cc:  Charlott Dorn LABOR, MD  Camie Sevin 04/15/2024 10:22 AM      "

## 2024-04-15 ENCOUNTER — Ambulatory Visit (INDEPENDENT_AMBULATORY_CARE_PROVIDER_SITE_OTHER): Admitting: Physician Assistant

## 2024-04-15 ENCOUNTER — Encounter: Payer: Self-pay | Admitting: Physician Assistant

## 2024-04-15 VITALS — BP 104/71 | HR 72 | Resp 20 | Wt 177.0 lb

## 2024-04-15 DIAGNOSIS — G3184 Mild cognitive impairment, so stated: Secondary | ICD-10-CM | POA: Diagnosis not present

## 2024-04-20 DIAGNOSIS — E782 Mixed hyperlipidemia: Secondary | ICD-10-CM | POA: Diagnosis not present

## 2024-04-20 DIAGNOSIS — I10 Essential (primary) hypertension: Secondary | ICD-10-CM | POA: Diagnosis not present

## 2024-04-20 DIAGNOSIS — E1142 Type 2 diabetes mellitus with diabetic polyneuropathy: Secondary | ICD-10-CM | POA: Diagnosis not present

## 2024-04-20 DIAGNOSIS — N183 Chronic kidney disease, stage 3 unspecified: Secondary | ICD-10-CM | POA: Diagnosis not present

## 2024-04-20 DIAGNOSIS — Z8546 Personal history of malignant neoplasm of prostate: Secondary | ICD-10-CM | POA: Diagnosis not present

## 2024-04-20 IMAGING — MR MR HEAD W/O CM
10 series · 48 of 48 positions shown · non-contrast
Comparison: CT head without contrast 05/10/2021. MRI of the head
without contrast 02/05/2010

CLINICAL DATA: History of CVA. Question subsequent infarct.

EXAM:
MRI HEAD WITHOUT CONTRAST
TECHNIQUE: Multiplanar, multiecho pulse sequences of the brain and surrounding
structures were obtained without intravenous contrast.

[Series 2: T1 · sagittal · 5.0mm · 0.45mm/px · 3 of 21 slices shown]
[im 1/21]
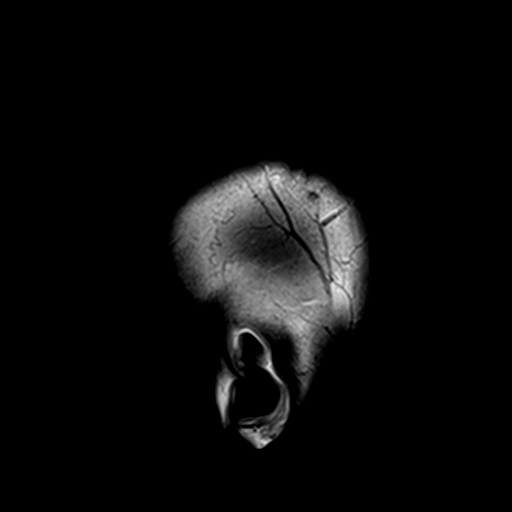
[im 11/21]
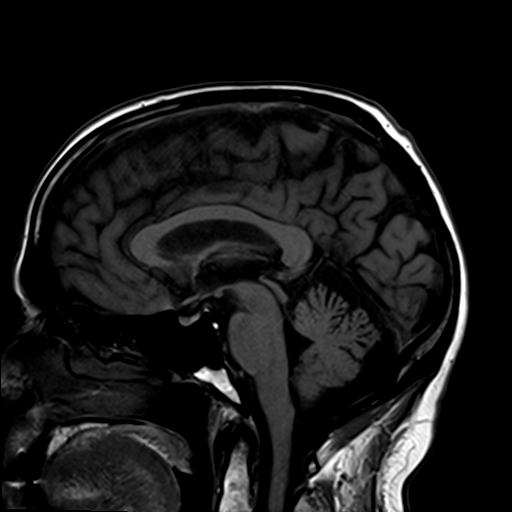
[im 21/21]
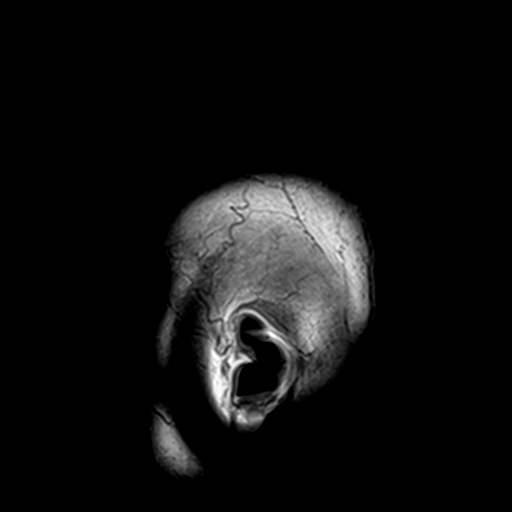

[Series 3: DWI · axial · 3.0mm · 1.80mm/px · z∈[-49,+110]mm · 9 of 108 slices shown (1 of 4)]
[im 1/108]
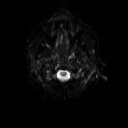
[im 14/108]
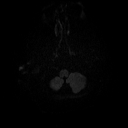
[im 27/108]
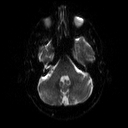
[im 41/108]
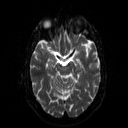
[im 54/108]
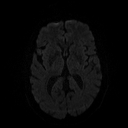
[im 67/108]
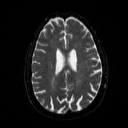
[im 81/108]
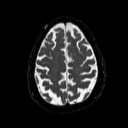
[im 94/108]
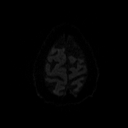
[im 108/108]
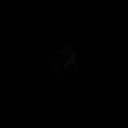

[Series 4: DWI · axial · 3.0mm · 1.80mm/px · z∈[-49,+110]mm · 4 of 50 slices shown (2 of 4)]
[im 1/50]
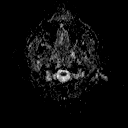
[im 17/50]
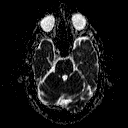
[im 33/50]
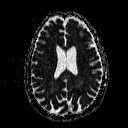
[im 50/50]
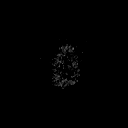

[Series 5: DWI · coronal · 5.0mm · 1.80mm/px · 7 of 78 slices shown (3 of 4)]
[im 1/78]
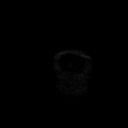
[im 13/78]
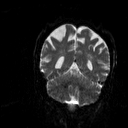
[im 26/78]
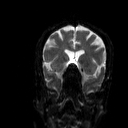
[im 39/78]
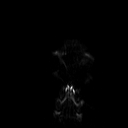
[im 52/78]
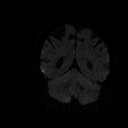
[im 65/78]
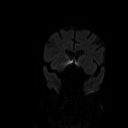
[im 78/78]
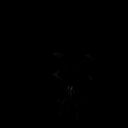

[Series 6: DWI · coronal · 5.0mm · 1.80mm/px · 3 of 38 slices shown (4 of 4)]
[im 1/38]
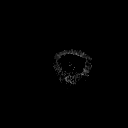
[im 19/38]
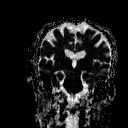
[im 38/38]
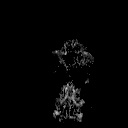

[Series 7: T2 · axial · 5.0mm · 0.51mm/px · z∈[-61,+100]mm · 2 of 24 slices shown (1 of 2)]
[im 1/24]
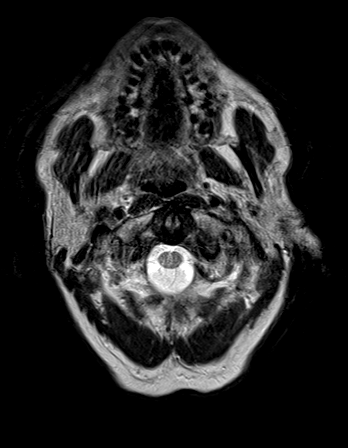
[im 24/24]
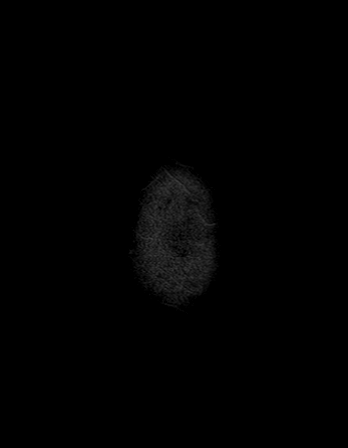

[Series 8: FLAIR · axial · 3.0mm · 0.45mm/px · z∈[-48,+87]mm · 3 of 30 slices shown]
[im 1/30]
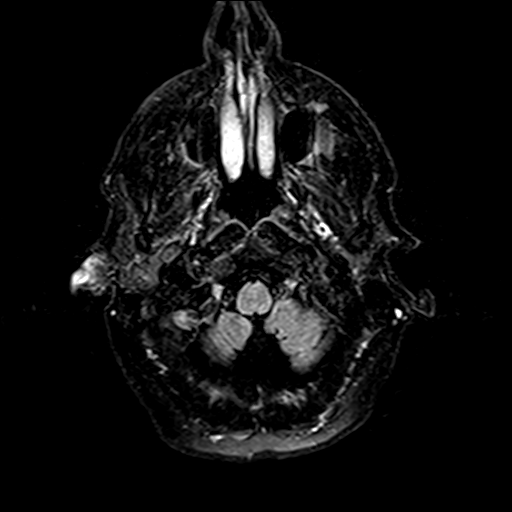
[im 15/30]
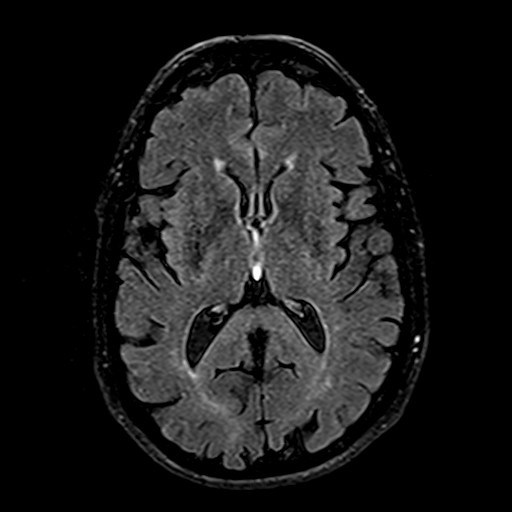
[im 30/30]
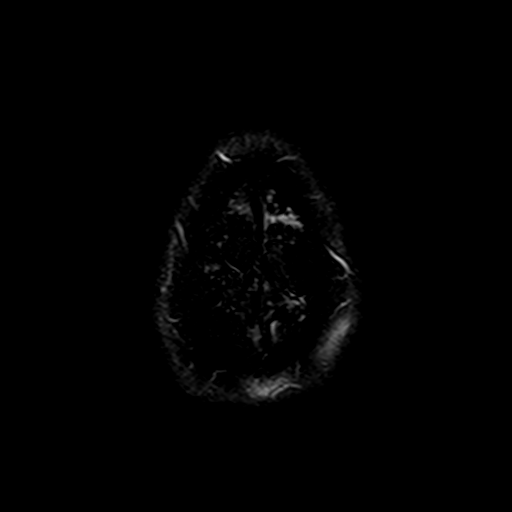

[Series 10: swi_images · axial · 4.0mm · 0.90mm/px · z∈[-51,+89]mm · 3 of 36 slices shown]
[im 1/36]
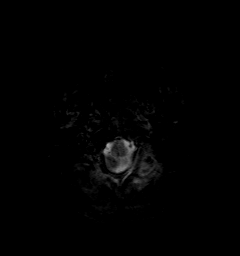
[im 18/36]
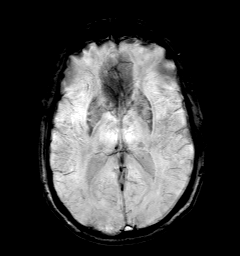
[im 36/36]
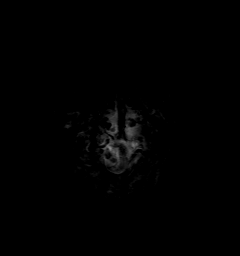

[Series 11: t1_mpr_tra · axial · 1.0mm · 0.71mm/px · z∈[-52,+91]mm · 12 of 144 slices shown]
[im 1/144]
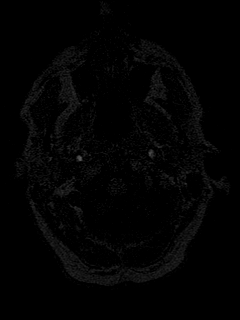
[im 14/144]
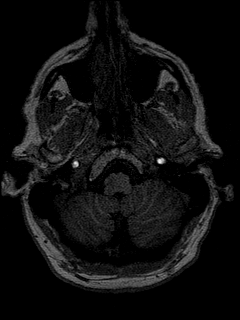
[im 27/144]
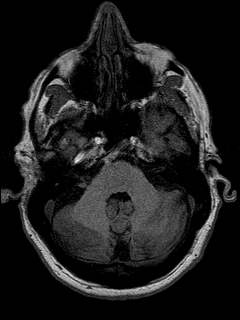
[im 40/144]
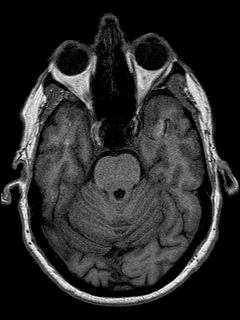
[im 53/144]
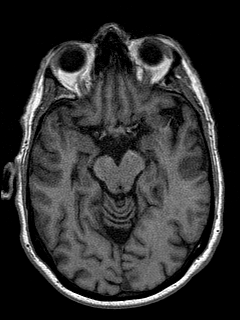
[im 66/144]
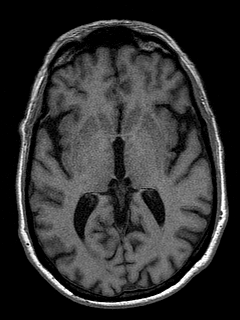
[im 79/144]
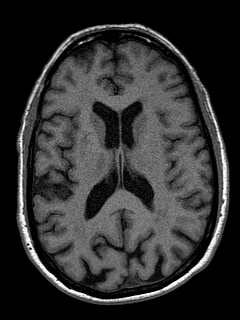
[im 92/144]
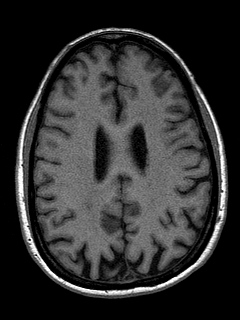
[im 105/144]
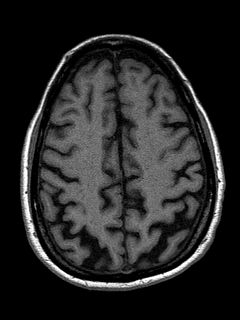
[im 118/144]
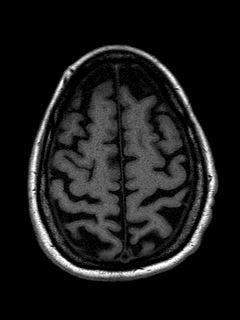
[im 131/144]
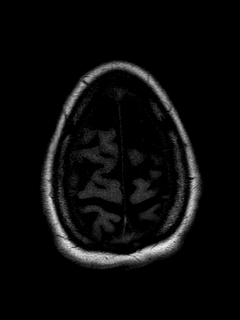
[im 144/144]
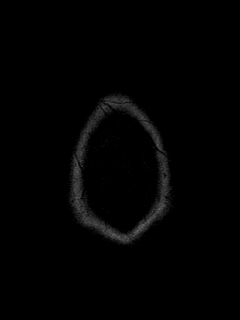

[Series 12: T2 · coronal · 5.0mm · 0.45mm/px · 2 of 29 slices shown (2 of 2)]
[im 1/29]
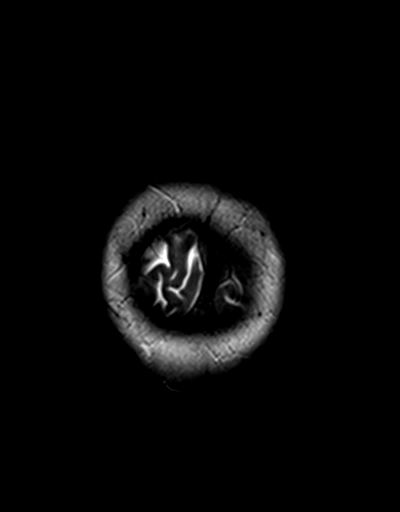
[im 29/29]
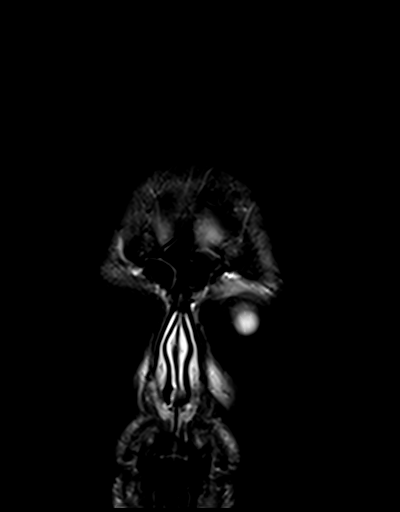

[48 of 48 positions shown; findings below may reference images not displayed]

FINDINGS: Brain: Periventricular white matter changes are mildly advanced for
age, progressed since 1677.

No acute infarct, hemorrhage, or mass lesion is present. The
ventricles are of normal size. No significant extraaxial fluid
collection is present.

The internal auditory canals are within normal limits. The brainstem
and cerebellum are within normal limits.

Vascular: Flow is present in the major intracranial arteries.

Skull and upper cervical spine: The craniocervical junction is
normal. Upper cervical spine is within normal limits. Marrow signal
is unremarkable.

Sinuses/Orbits: The paranasal sinuses and mastoid air cells are
clear. Bilateral lens replacements are noted. Globes and orbits are
otherwise unremarkable.
IMPRESSION: 1. No acute intracranial abnormality.
2. Periventricular white matter changes are mildly advanced for age,
progressed since 1677. This likely reflects the sequela of chronic
microvascular ischemia.

## 2024-05-07 ENCOUNTER — Emergency Department (HOSPITAL_BASED_OUTPATIENT_CLINIC_OR_DEPARTMENT_OTHER)

## 2024-05-07 ENCOUNTER — Encounter (HOSPITAL_BASED_OUTPATIENT_CLINIC_OR_DEPARTMENT_OTHER): Payer: Self-pay

## 2024-05-07 ENCOUNTER — Other Ambulatory Visit: Payer: Self-pay

## 2024-05-07 ENCOUNTER — Emergency Department (HOSPITAL_BASED_OUTPATIENT_CLINIC_OR_DEPARTMENT_OTHER)
Admission: EM | Admit: 2024-05-07 | Discharge: 2024-05-07 | Disposition: A | Discharge status: Home / Self Care | Attending: Emergency Medicine | Admitting: Emergency Medicine

## 2024-05-07 DIAGNOSIS — Z7984 Long term (current) use of oral hypoglycemic drugs: Secondary | ICD-10-CM | POA: Insufficient documentation

## 2024-05-07 DIAGNOSIS — Z7982 Long term (current) use of aspirin: Secondary | ICD-10-CM | POA: Diagnosis not present

## 2024-05-07 DIAGNOSIS — M79604 Pain in right leg: Secondary | ICD-10-CM | POA: Diagnosis present

## 2024-05-07 DIAGNOSIS — Z79899 Other long term (current) drug therapy: Secondary | ICD-10-CM | POA: Diagnosis not present

## 2024-05-07 DIAGNOSIS — I87001 Postthrombotic syndrome without complications of right lower extremity: Secondary | ICD-10-CM | POA: Diagnosis not present

## 2024-05-07 NOTE — ED Provider Notes (Signed)
 [place] [facility]PARTMENT AT [facility] Provider Note   CSN: [id number] Arrival date & time: 05/07/24  1839     Patient presents with: Leg Pain   Draden Cottingham Canady is a 78 y.o. male.   HPI     78 year old male comes in with chief complaint of leg pain. Patient reports that he has history of blood clot that required vascular surgeons to strip the vein.  Patient was at the gym today, and he noted a pea-sized area right behind the knee on 2 separate locations.  He is supposed to go to [place] soon and he wants to make sure he does not have a blood clot.  Patient has no significant discomfort over that area.  Prior to Admission medications  Medication Sig Start Date End Date Taking? Authorizing Provider  acetaminophen  (TYLENOL ) 325 MG tablet Take 650 mg by mouth every 6 (six) hours as needed for mild pain or moderate pain.     [provider]  aspirin  81 MG tablet Take 162 mg by mouth daily.     [provider]  atorvastatin  (LIPITOR) 20 MG tablet Take 20 mg by mouth every evening.     [provider]  cholecalciferol (VITAMIN D ) 1000 UNITS tablet Take 2,000 Units by mouth every evening.     [provider]  fenofibrate  (TRICOR ) 145 MG tablet Take 145 mg by mouth every evening.     [provider]  glipiZIDE  (GLUCOTROL  XL) 2.5 MG 24 hr tablet Take 2.5 mg by mouth daily. 01/01/20   [provider]  HYDROcodone -acetaminophen  (NORCO/VICODIN) 5-325 MG tablet Take 1 tablet by mouth every 6 (six) hours as needed for moderate pain.    [provider]  lidocaine  (LIDODERM ) 5 % Place 1 patch onto the skin daily. Remove & Discard patch within 12 hours or as directed by MD 05/10/21   Henderly, Britni A, PA-C  memantine  (NAMENDA ) 5 MG tablet TAKE 1 TABLET(5 MG) BY MOUTH AT NIGHT FOR 2 WEEKS. INCREASE TO 1 TABLET 5 MG 2 TIMES A DAY 03/12/24   Dina, Sara E, PA-C  metFORMIN  (GLUCOPHAGE -XR) 750 MG 24 hr tablet Take 750  mg by mouth 2 (two) times daily. 02/16/20   [provider]  methylPREDNISolone  (MEDROL  DOSEPAK) 4 MG TBPK tablet Take 4 mg by mouth daily. Taper 03/03/20   [provider]  metoprolol  tartrate (LOPRESSOR ) 25 MG tablet Take 25 mg by mouth 2 (two) times daily.    [provider]  sertraline  (ZOLOFT ) 100 MG tablet Take 100 mg by mouth every evening.     [provider]  telmisartan (MICARDIS) 40 MG tablet Take 40 mg by mouth daily.     [provider]    Allergies: Donepezil hcl and Shellfish allergy    Review of Systems  All other systems reviewed and are negative.   Updated Vital Signs BP (!) 153/96   Pulse 65   Temp 98 F (36.7 C)   Resp 18   SpO2 98%   Physical Exam Vitals and nursing note reviewed.  Constitutional:      Appearance: He is well-developed.  HENT:     Head: Atraumatic.  Cardiovascular:     Rate and Rhythm: Normal rate.  Pulmonary:     Effort: Pulmonary effort is normal.  Musculoskeletal:        General: No tenderness.     Cervical back: Neck supple.     Right lower leg: No edema.  Left lower leg: No edema.  Skin:    General: Skin is warm.     Comments: Just distal to the popliteal region, patient has hardened vein  Neurological:     Mental Status: He is alert and oriented to person, place, and time.     (all labs ordered are listed, but only abnormal results are displayed) Labs Reviewed - No data to display  EKG: None  Radiology: US  Venous Img Lower Unilateral Right Result Date: 05/07/2024 EXAM: ULTRASOUND DUPLEX OF THE RIGHT LOWER EXTREMITY VEINS TECHNIQUE: Duplex ultrasound using B-mode/gray scaled imaging and Doppler spectral analysis and color flow was obtained of the deep venous structures of the right lower extremity. COMPARISON: None available. CLINICAL HISTORY: Swelling Swelling FINDINGS: The common femoral vein, femoral vein, popliteal vein, and posterior tibial vein demonstrate normal  compressibility with normal color flow and spectral analysis. IMPRESSION: 1. No evidence of DVT. Electronically signed by: Oneil Devonshire MD 05/07/2024 08:13 PM EST RP Workstation: [id number]     Procedures   Medications Ordered in the ED - No data to display                                  Medical Decision Making  78 year old male comes in with chief complaint of Pea shaped swelling on 2 different locations with previous history of DVT.  Differential diagnosis includes post thrombotic syndrome of the vein, DVT, Baker's cyst, hemangioma.  Ultrasound DVT was completed which is negative for any acute findings.  I recommended to the patient that he just needs to apply compression stockings or NSAIDs to the vein if it is getting larger and to discuss it at his next vascular surgery appointment.  No need for emergent evaluation for it.  Also discussed that the bleeding risk from that area is higher, and to apply firm pressure for at least 10 to 15 minutes if it starts bleeding.  Final diagnoses:  Post-thrombotic syndrome of right lower extremity    ED Discharge Orders     None          Charlyn Sora, MD 05/07/24 2139

## 2024-05-07 NOTE — Discharge Instructions (Addendum)
 You were seen in the emergency room for leg swelling. There is no evidence of blood clot.  I suspect that the hardened, dark appearing area is because of postthrombotic syndrome - which is typically a sequelae of poor healing/remodeling from high-pressure that develops because of blood clots that you had.  That area can be managed with compression stockings and continued exercise.  If there is pain, you can use NSAIDs such as ibuprofen.  You may discuss the area with your vascular surgeon at the next appointment if the area is growing in size or becoming more uncomfortable.

## 2024-05-07 NOTE — ED Triage Notes (Signed)
 Patient reports two knots in right leg the size of a pea. He says he has a hx of blood clots in that leg and that they have removed them in those spots in the past.
